# Patient Record
Sex: Male | Born: 1990 | Race: Black or African American | Hispanic: No | Marital: Married | Smoking: Current every day smoker
Health system: Southern US, Community
[De-identification: ages and names within clinical notes are randomized; demographics above are authoritative.]

## PROBLEM LIST (undated history)

## (undated) DIAGNOSIS — K219 Gastro-esophageal reflux disease without esophagitis: Secondary | ICD-10-CM

---

## 2014-03-01 ENCOUNTER — Encounter (HOSPITAL_COMMUNITY): Payer: Self-pay | Admitting: Adult Health

## 2014-03-01 ENCOUNTER — Emergency Department (HOSPITAL_COMMUNITY)
Admission: EM | Admit: 2014-03-01 | Discharge: 2014-03-02 | Disposition: A | Discharge status: Home / Self Care | Payer: Worker's Compensation | Attending: Emergency Medicine | Admitting: Emergency Medicine

## 2014-03-01 DIAGNOSIS — Z720 Tobacco use: Secondary | ICD-10-CM | POA: Insufficient documentation

## 2014-03-01 DIAGNOSIS — Z8719 Personal history of other diseases of the digestive system: Secondary | ICD-10-CM | POA: Insufficient documentation

## 2014-03-01 DIAGNOSIS — T148XXA Other injury of unspecified body region, initial encounter: Secondary | ICD-10-CM

## 2014-03-01 DIAGNOSIS — Y9289 Other specified places as the place of occurrence of the external cause: Secondary | ICD-10-CM | POA: Insufficient documentation

## 2014-03-01 DIAGNOSIS — Y990 Civilian activity done for income or pay: Secondary | ICD-10-CM | POA: Insufficient documentation

## 2014-03-01 DIAGNOSIS — W2209XA Striking against other stationary object, initial encounter: Secondary | ICD-10-CM | POA: Insufficient documentation

## 2014-03-01 DIAGNOSIS — S50811A Abrasion of right forearm, initial encounter: Secondary | ICD-10-CM | POA: Insufficient documentation

## 2014-03-01 DIAGNOSIS — Z23 Encounter for immunization: Secondary | ICD-10-CM | POA: Insufficient documentation

## 2014-03-01 DIAGNOSIS — Y9389 Activity, other specified: Secondary | ICD-10-CM | POA: Insufficient documentation

## 2014-03-01 HISTORY — DX: Gastro-esophageal reflux disease without esophagitis: K21.9

## 2014-03-01 MED ORDER — TETANUS-DIPHTH-ACELL PERTUSSIS 5-2.5-18.5 LF-MCG/0.5 IM SUSP
0.5000 mL | Freq: Once | INTRAMUSCULAR | Status: AC
  Administered 2014-03-02: 0.5 mL via INTRAMUSCULAR
  Filled 2014-03-01: qty 0.5

## 2014-03-01 NOTE — ED Notes (Signed)
Presents with right arm laceration from a door hinge at work. Bleeding controlled

## 2014-03-01 NOTE — ED Provider Notes (Signed)
CSN: [id number]     Arrival date & time 03/01/14  2321 History   First MD Initiated Contact with Patient 03/01/14 2346     Chief Complaint  Patient presents with  . Laceration     (Consider location/radiation/quality/duration/timing/severity/associated sxs/prior Treatment) HPI Kevin Foley is a 23 y.o. male who comes in for evaluation of right arm abrasion. Patient states he was working at [employer] when he scraped his arm against the hinge of the freezer door. He reports washing it off while at work. Bleeding is controlled. Abrasion is painful to palpation. He is unsure when his last tetanus was. He declines any pain at this time. Hemostasis achieved PTA. No other associated symptoms. No other modifying factors  Past Medical History  Diagnosis Date  . GERD (gastroesophageal reflux disease)    History reviewed. No pertinent past surgical history. History reviewed. No pertinent family history. History  Substance Use Topics  . Smoking status: Current Every Day Smoker    Types: Cigarettes  . Smokeless tobacco: Not on file  . Alcohol Use: Yes    Review of Systems  Constitutional: Negative for fever.  Respiratory: Negative for shortness of breath.   Cardiovascular: Negative for chest pain.  Skin: Positive for wound. Negative for rash.      Allergies  Review of patient's allergies indicates no known allergies.  Home Medications   Prior to Admission medications   Not on File   BP 128/94 mmHg  Pulse 65  Temp(Src) 97.8 F (36.6 C) (Oral)  Resp 18  SpO2 99% Physical Exam  Constitutional:  Awake, alert, nontoxic appearance.  HENT:  Head: Atraumatic.  Eyes: Right eye exhibits no discharge. Left eye exhibits no discharge.  Neck: Neck supple.  Pulmonary/Chest: Effort normal. He exhibits no tenderness.  Abdominal: Soft. There is no tenderness. There is no rebound.  Musculoskeletal: He exhibits no tenderness.  Baseline ROM, no obvious new focal weakness.  Neurological:   Mental status and motor strength appears baseline for patient and situation.  Skin: No rash noted.  Small linear, superficial abrasion to the medial aspect of the proximal forearm.  Psychiatric: He has a normal mood and affect.  Nursing note and vitals reviewed.   ED Course  Procedures (including critical care time) Labs Review Labs Reviewed - No data to display  Imaging Review No results found.   EKG Interpretation None     Meds given in ED:  Medications  Tdap (BOOSTRIX) injection 0.5 mL (0.5 mLs Intramuscular Given 03/02/14 0015)    There are no discharge medications for this patient.  Filed Vitals:   03/01/14 2339  BP: 128/94  Pulse: 65  Temp: 97.8 F (36.6 C)  TempSrc: Oral  Resp: 18  SpO2: 99%    MDM  Vitals stable - WNL -afebrile Pt resting comfortably in ED. PE--small abrasion to the medial aspect of right arm. Neurovascularly intact. Distal pulses intact. Tetanus updated in ED. Bacitracin and dressing applied to abrasion. UDS obtained for [employer] purposes. Discussed f/u with PCP and return precautions, pt very amenable to plan. Patient stable, in good condition and is appropriate for discharge   Final diagnoses:  Ch[place], PA-C 03/02/14 [place], MD 03/02/14 1623

## 2014-03-02 NOTE — ED Notes (Signed)
Sample collected by lab

## 2014-03-02 NOTE — Discharge Instructions (Signed)
Abrasion An abrasion is a cut or scrape of the skin. Abrasions do not extend through all layers of the skin and most heal within 10 days. It is important to care for your abrasion properly to prevent infection. CAUSES  Most abrasions are caused by falling on, or gliding across, the ground or other surface. When your skin rubs on something, the outer and inner layer of skin rubs off, causing an abrasion. DIAGNOSIS  Your caregiver will be able to diagnose an abrasion during a physical exam.  TREATMENT  Your treatment depends on how large and deep the abrasion is. Generally, your abrasion will be cleaned with water and a mild soap to remove any dirt or debris. An antibiotic ointment may be put over the abrasion to prevent an infection. A bandage (dressing) may be wrapped around the abrasion to keep it from getting dirty.  You may need a tetanus shot if:  You cannot remember when you had your last tetanus shot.  You have never had a tetanus shot.  The injury broke your skin. If you get a tetanus shot, your arm may swell, get red, and feel warm to the touch. This is common and not a problem. If you need a tetanus shot and you choose not to have one, there is a rare chance of getting tetanus. Sickness from tetanus can be serious.  HOME CARE INSTRUCTIONS   If a dressing was applied, change it at least once a day or as directed by your caregiver. If the bandage sticks, soak it off with warm water.   Wash the area with water and a mild soap to remove all the ointment 2 times a day. Rinse off the soap and pat the area dry with a clean towel.   Reapply any ointment as directed by your caregiver. This will help prevent infection and keep the bandage from sticking. Use gauze over the wound and under the dressing to help keep the bandage from sticking.   Change your dressing right away if it becomes wet or dirty.   Only take over-the-counter or prescription medicines for pain, discomfort, or fever as  directed by your caregiver.   Follow up with your caregiver within 24-48 hours for a wound check, or as directed. If you were not given a wound-check appointment, look closely at your abrasion for redness, swelling, or pus. These are signs of infection. SEEK IMMEDIATE MEDICAL CARE IF:   You have increasing pain in the wound.   You have redness, swelling, or tenderness around the wound.   You have pus coming from the wound.   You have a fever or persistent symptoms for more than 2-3 days.  You have a fever and your symptoms suddenly get worse.  You have a bad smell coming from the wound or dressing.  MAKE SURE YOU:   Understand these instructions.  Will watch your condition.  Will get help right away if you are not doing well or get worse. Document Released: 12/08/2004 Document Revised: 02/15/2012 Document Reviewed: 02/01/2011 [facility] Patient Information 2015 [place], [place]. This information is not intended to replace advice given to you by your health care provider. Make sure you discuss any questions you have with your health care provider.   You were evaluated in the ED today for the cut on her arm. There does not appear to be an emergent symptom caused by your injury. You were given a antibiotic dressing in the ED. You may follow-up with primary care for further evaluation  and management of your symptoms. Return to ED for worsening symptoms, redness or swelling to the site numbness or weakness

## 2014-03-02 NOTE — ED Notes (Signed)
Urine sample for drug screen was unable to be used for drug test.  Second sample to be collected

## 2017-01-22 ENCOUNTER — Emergency Department (HOSPITAL_COMMUNITY)
Admission: EM | Admit: 2017-01-22 | Discharge: 2017-01-22 | Disposition: A | Discharge status: Home / Self Care | Payer: Self-pay | Attending: Emergency Medicine | Admitting: Emergency Medicine

## 2017-01-22 ENCOUNTER — Emergency Department (HOSPITAL_COMMUNITY): Payer: Self-pay

## 2017-01-22 ENCOUNTER — Encounter (HOSPITAL_COMMUNITY): Payer: Self-pay

## 2017-01-22 DIAGNOSIS — F17210 Nicotine dependence, cigarettes, uncomplicated: Secondary | ICD-10-CM | POA: Insufficient documentation

## 2017-01-22 DIAGNOSIS — M25572 Pain in left ankle and joints of left foot: Secondary | ICD-10-CM | POA: Insufficient documentation

## 2017-01-22 MED ORDER — NAPROXEN 250 MG PO TABS
500.0000 mg | ORAL_TABLET | Freq: Once | ORAL | Status: AC
  Administered 2017-01-22: 500 mg via ORAL
  Filled 2017-01-22: qty 2

## 2017-01-22 MED ORDER — ACETAMINOPHEN 500 MG PO TABS
1000.0000 mg | ORAL_TABLET | Freq: Once | ORAL | Status: AC
  Administered 2017-01-22: 1000 mg via ORAL
  Filled 2017-01-22: qty 2

## 2017-01-22 NOTE — ED Triage Notes (Signed)
Patient complains of left ankle pain with ambulation x 2 days, denies trauma.  States that he has old old injury but no new trauma

## 2017-01-22 NOTE — Discharge Instructions (Signed)
Ice, elevate the foot and make sure you are wearing shoe with a good sole.  Use the crutches as needed.  Take Aleve or 600 mg ibuprofen for inflammation.  You can also take additional Tylenol for pain.  Return immediately if you develop redness, fever or discoloration of your foot

## 2017-01-22 NOTE — Progress Notes (Signed)
Orthopedic Tech Progress Note Patient Details:  Kevin Foley 06/19/1990 [id number]  Ortho Devices Type of Ortho Device: Ace wrap, Crutches Ortho Device/Splint Location: lle Ortho Device/Splint Interventions: Application   Kit Brubacher 01/22/2017, 9:10 AM

## 2017-01-22 NOTE — ED Provider Notes (Signed)
[place] EMERGENCY DEPARTMENT Provider Note   CSN: [id number] Arrival date & time: 01/22/17  0732     History   Chief Complaint No chief complaint on file.   HPI Kevin Foley is a 26 y.o. male.  Patient is a 26 year old male who is healthy presenting today with worsening left ankle and foot pain.  Patient denies any trauma but states he does typically stand and walk at work for long periods of time.  He denies any new shoes but states years ago he did have a hairline fracture in that ankle.  He cannot recall if it appears more swollen.  The pain is significantly worse with walking and shoots up his leg.  He describes the pain is dull all the time but then sharp pins and needles when attempting to walk.  He states the pain was so bad yesterday that he was unable to walk.  Also patient states he is tried to soak it in Epson salt and 2 days ago took Tylenol but the pain has not resolved.   The history is provided by the patient.    Past Medical History:  Diagnosis Date  . GERD (gastroesophageal reflux disease)     There are no active problems to display for this patient.   History reviewed. No pertinent surgical history.     Home Medications    Prior to Admission medications   Not on File    Family History No family history on file.  Social History Social History   Tobacco Use  . Smoking status: Current Foley Day Smoker    Types: Cigarettes  . Smokeless tobacco: Never Used  Substance Use Topics  . Alcohol use: Yes  . Drug use: No     Allergies   Patient has no known allergies.   Review of Systems Review of Systems  All other systems reviewed and are negative.    Physical Exam Updated Vital Signs BP 115/83 (BP Location: Right Arm)   Pulse 93   Temp 97.7 F (36.5 C) (Oral)   Resp 17   SpO2 98%   Physical Exam  Constitutional: He is oriented to person, place, and time. He appears well-developed and well-nourished. No  distress.  HENT:  Head: Normocephalic and atraumatic.  Eyes: EOM are normal. Pupils are equal, round, and reactive to light.  Cardiovascular: Normal rate and intact distal pulses.  Pulmonary/Chest: Effort normal.  Musculoskeletal: He exhibits tenderness.       Left ankle: He exhibits decreased range of motion and swelling. He exhibits normal pulse. Tenderness. Lateral malleolus and medial malleolus tenderness found. No head of 5th metatarsal and no proximal fibula tenderness found. Achilles tendon exhibits pain. Achilles tendon exhibits no defect.       Feet:  Minimal swelling of the left ankle no warmth or erythema of the joint.  No calf pain or swelling.  Neurological: He is alert and oriented to person, place, and time.  Skin: Skin is warm. Capillary refill takes less than 2 seconds.  Psychiatric: He has a normal mood and affect. His behavior is normal.  Nursing note and vitals reviewed.    ED Treatments / Results  Labs (all labs ordered are listed, but only abnormal results are displayed) Labs Reviewed - No data to display  EKG  EKG Interpretation None       Radiology Dg Ankle Complete Left  Result Date: 01/22/2017 CLINICAL DATA:  Left ankle pain and swelling.  No known trauma. EXAM: LEFT ANKLE  COMPLETE - 3+ VIEW COMPARISON:  None. FINDINGS: There is mild diffuse soft tissue swelling about the imaged lower leg, ankle and hindfoot. No associated fracture or dislocation. Joint spaces appear preserved. The ankle mortise appears preserved. No definite ankle joint effusion. No significant plantar calcaneal spur. No radiopaque foreign body. IMPRESSION: Nonspecific mild diffuse soft tissue swelling about the imaged lower leg, ankle and hindfoot without associated fracture, dislocation or radiopaque foreign body. Electronically Signed   By: Sandi Mariscal M.D.   On: 01/22/2017 08:39    Procedures Procedures (including critical care time)  Medications Ordered in ED Medications    acetaminophen (TYLENOL) tablet 1,000 mg (not administered)  naproxen (NAPROSYN) tablet 500 mg (not administered)     Initial Impression / Assessment and Plan / ED Course  I have reviewed the triage vital signs and the nursing notes.  Pertinent labs & imaging results that were available during my care of the patient were reviewed by me and considered in my medical decision making (see chart for details).     Patient presenting with worsening pain in the left foot causing it to be difficult to walk.  On exam it is hard to discern whether patient's pain is related to the ankle joint or the fascia.  Patient does have significant tenderness with palpation of the plantar fascia and the Achilles tendon but also has pain with range of motion of the ankle joint and palpation of the lateral medial malleolus.  He denies any trauma there is some mild swelling but no exam findings concerning for gout.  Patient may be having a tendinopathy or plantar fasciitis but will do plain images to ensure no other etiology.  Patient is having pain but significant increased pain and range of motion and axial loading.  Patient has not been taking oral therapy for the pain and was given Tylenol and naproxen.  8:51 AM Imaging is negative for bony abnormality but does show diffuse soft tissue swelling.  Again patient has no signs of infection there is no warmth, erythema and low suspicion for DVT.  Will treat with Ace wrap, anti-inflammatories, ice and elevation.  Patient given follow-up in 1 week if symptoms are not improved.  Final Clinical Impressions(s) / ED Diagnoses   Final diagnoses:  Acute left ankle pain    ED Discharge Orders    None       Blanchie Dessert, MD 01/22/17 [phone]

## 2018-04-14 ENCOUNTER — Encounter (HOSPITAL_COMMUNITY): Payer: Self-pay | Admitting: Emergency Medicine

## 2018-04-14 ENCOUNTER — Emergency Department (HOSPITAL_COMMUNITY)
Admission: EM | Admit: 2018-04-14 | Discharge: 2018-04-14 | Disposition: A | Discharge status: Home / Self Care | Payer: Self-pay | Attending: Emergency Medicine | Admitting: Emergency Medicine

## 2018-04-14 ENCOUNTER — Emergency Department (HOSPITAL_COMMUNITY): Payer: Self-pay

## 2018-04-14 DIAGNOSIS — F17210 Nicotine dependence, cigarettes, uncomplicated: Secondary | ICD-10-CM | POA: Insufficient documentation

## 2018-04-14 DIAGNOSIS — M25571 Pain in right ankle and joints of right foot: Secondary | ICD-10-CM | POA: Insufficient documentation

## 2018-04-14 MED ORDER — PREDNISONE 10 MG PO TABS: 20.0000 mg | ORAL_TABLET | Freq: Two times a day (BID) | ORAL | 0 refills | Status: AC

## 2018-04-14 MED ORDER — HYDROCODONE-ACETAMINOPHEN 5-325 MG PO TABS: 1.0000 | ORAL_TABLET | Freq: Four times a day (QID) | ORAL | 0 refills | Status: AC | PRN

## 2018-04-14 NOTE — ED Notes (Signed)
Patient transported to X-ray 

## 2018-04-14 NOTE — ED Triage Notes (Signed)
  Patient comes in with R ankle pain that has been going on for a couple days.  Patient states he hurt his ankle and feels shooting pain up his calf.  Patient has been using crutches.  Patient states he cant sleep at night.  Pain 10/10.

## 2018-04-14 NOTE — ED Provider Notes (Signed)
[place] EMERGENCY DEPARTMENT Provider Note   CSN: [id number] Arrival date & time: 04/14/18  0451     History   Chief Complaint Chief Complaint  Patient presents with  . Ankle Pain    HPI Kevin Foley is a 28 y.o. male.  Patient is a 28 year old male with no significant past medical history.  He presents today with complaints of right ankle pain.  This began 2 days ago in the absence of any specific injury or trauma.  He states he went to get out of bed and his ankle was hurting him.  He denies any new injury or or activities that would explain his discomfort.  The history is provided by the patient.  Ankle Pain  Location:  Ankle Injury: no   Ankle location:  R ankle Pain details:    Quality:  Throbbing   Radiates to:  Does not radiate   Severity:  Severe   Onset quality:  Sudden   Duration:  2 days   Timing:  Constant   Progression:  Worsening Chronicity:  New Relieved by:  Nothing Worsened by:  Bearing weight Ineffective treatments:  None tried   Past Medical History:  Diagnosis Date  . GERD (gastroesophageal reflux disease)     There are no active problems to display for this patient.   History reviewed. No pertinent surgical history.      Home Medications    Prior to Admission medications   Not on File    Family History History reviewed. No pertinent family history.  Social History Social History   Tobacco Use  . Smoking status: Current Every Day Smoker    Types: Cigarettes  . Smokeless tobacco: Never Used  Substance Use Topics  . Alcohol use: Yes  . Drug use: No     Allergies   Patient has no known allergies.   Review of Systems Review of Systems  All other systems reviewed and are negative.    Physical Exam Updated Vital Signs BP 119/66 (BP Location: Right Arm)   Pulse 89   Temp 98.3 F (36.8 C) (Oral)   Resp 18   Ht 5\' 11"  (1.803 m)   Wt 127.5 kg   SpO2 99%   BMI 39.19 kg/m   Physical  Exam Vitals signs and nursing note reviewed.  Constitutional:      General: He is not in acute distress.    Appearance: Normal appearance. He is not ill-appearing.  HENT:     Head: Normocephalic and atraumatic.  Pulmonary:     Effort: Pulmonary effort is normal.  Musculoskeletal:     Comments: The right ankle appears grossly normal.  There is no significant swelling.  He has tenderness over the Achilles tendon, lateral malleolus, and medial malleolus.  There is no warmth or erythema.  He has pain with range of motion.  Skin:    General: Skin is warm and dry.  Neurological:     Mental Status: He is alert.      ED Treatments / Results  Labs (all labs ordered are listed, but only abnormal results are displayed) Labs Reviewed - No data to display  EKG None  Radiology No results found.  Procedures Procedures (including critical care time)  Medications Ordered in ED Medications - No data to display   Initial Impression / Assessment and Plan / ED Course  I have reviewed the triage vital signs and the nursing notes.  Pertinent labs & imaging results that were available during  my care of the patient were reviewed by me and considered in my medical decision making (see chart for details).  Patient with a 2-day history of nontraumatic right ankle pain.  He has pain with range of motion and palpation.  There is no redness or warmth.  His x-rays are negative.  I suspect gout.  He will be treated with prednisone, pain medicine, and is to follow-up as needed.  Final Clinical Impressions(s) / ED Diagnoses   Final diagnoses:  None    ED Discharge Orders    None       Veryl Speak, MD 04/14/18 [phone]

## 2018-04-14 NOTE — Discharge Instructions (Signed)
Prednisone as prescribed.  Hydrocodone as prescribed as needed for pain.  Return to the emergency department if symptoms worsen or change, and follow-up with your primary doctor if not improving in the next 2 to 3 days.

## 2019-10-03 ENCOUNTER — Ambulatory Visit: Payer: Self-pay | Attending: Internal Medicine

## 2019-10-03 DIAGNOSIS — Z23 Encounter for immunization: Secondary | ICD-10-CM

## 2019-10-03 NOTE — Progress Notes (Signed)
   Covid-19 Vaccination Clinic  Name:  Hooper Petteway    MRN: [id number] DOB: [birth year]  10/03/2019  Mr. Bethel was observed post Covid-19 immunization for 15 minutes without incident. He was provided with Vaccine Information Sheet and instruction to access the V-Safe system.   Mr. Fjelstad was instructed to call 911 with any severe reactions post vaccine: Marland Kitchen Difficulty breathing  . Swelling of face and throat  . A fast heartbeat  . A bad rash all over body  . Dizziness and weakness   Immunizations Administered    Name Date Dose VIS Date Route   Pfizer COVID-19 Vaccine 10/03/2019  3:17 PM 0.3 mL 05/08/2018 Intramuscular   Manufacturer: [employer]   Lot: [id number]   [place]: [id number]

## 2019-10-29 ENCOUNTER — Ambulatory Visit: Payer: Self-pay | Attending: Internal Medicine

## 2019-10-29 DIAGNOSIS — Z23 Encounter for immunization: Secondary | ICD-10-CM

## 2019-10-29 NOTE — Progress Notes (Signed)
   Covid-19 Vaccination Clinic  Name:  Kevin Foley    MRN: [id number] DOB: 1990-12-14  10/29/2019  Kevin Foley was observed post Covid-19 immunization for 15 minutes without incident. He was provided with Vaccine Information Sheet and instruction to access the V-Safe system.   Kevin Foley was instructed to call 911 with any severe reactions post vaccine: Marland Kitchen Difficulty breathing  . Swelling of face and throat  . A fast heartbeat  . A bad rash all over body  . Dizziness and weakness   Immunizations Administered    Name Date Dose VIS Date Route   Pfizer COVID-19 Vaccine 10/29/2019  2:49 PM 0.3 mL 05/08/2018 Intramuscular   Manufacturer: [place]   Lot: [id number]   [place]: [id number]

## 2020-04-10 ENCOUNTER — Other Ambulatory Visit: Payer: Self-pay

## 2020-04-10 DIAGNOSIS — Z20822 Contact with and (suspected) exposure to covid-19: Secondary | ICD-10-CM

## 2020-04-11 LAB — NOVEL CORONAVIRUS, NAA: SARS-CoV-2, NAA: DETECTED — AB

## 2020-04-11 LAB — SARS-COV-2, NAA 2 DAY TAT

## 2020-06-07 ENCOUNTER — Other Ambulatory Visit: Payer: Self-pay

## 2020-06-07 ENCOUNTER — Emergency Department (HOSPITAL_COMMUNITY): Payer: BC Managed Care – PPO

## 2020-06-07 ENCOUNTER — Emergency Department (HOSPITAL_COMMUNITY)
Admission: EM | Admit: 2020-06-07 | Discharge: 2020-06-07 | Disposition: A | Discharge status: Home / Self Care | Payer: BC Managed Care – PPO | Attending: Emergency Medicine | Admitting: Emergency Medicine

## 2020-06-07 ENCOUNTER — Encounter (HOSPITAL_COMMUNITY): Payer: Self-pay | Admitting: Emergency Medicine

## 2020-06-07 DIAGNOSIS — F17210 Nicotine dependence, cigarettes, uncomplicated: Secondary | ICD-10-CM | POA: Insufficient documentation

## 2020-06-07 DIAGNOSIS — R109 Unspecified abdominal pain: Secondary | ICD-10-CM

## 2020-06-07 DIAGNOSIS — M545 Low back pain, unspecified: Secondary | ICD-10-CM | POA: Diagnosis not present

## 2020-06-07 DIAGNOSIS — S060X1A Concussion with loss of consciousness of 30 minutes or less, initial encounter: Secondary | ICD-10-CM

## 2020-06-07 DIAGNOSIS — S0990XA Unspecified injury of head, initial encounter: Secondary | ICD-10-CM | POA: Diagnosis present

## 2020-06-07 DIAGNOSIS — R1032 Left lower quadrant pain: Secondary | ICD-10-CM | POA: Diagnosis not present

## 2020-06-07 DIAGNOSIS — Y92410 Unspecified street and highway as the place of occurrence of the external cause: Secondary | ICD-10-CM | POA: Diagnosis not present

## 2020-06-07 DIAGNOSIS — S060X9A Concussion with loss of consciousness of unspecified duration, initial encounter: Secondary | ICD-10-CM | POA: Insufficient documentation

## 2020-06-07 DIAGNOSIS — M542 Cervicalgia: Secondary | ICD-10-CM | POA: Diagnosis not present

## 2020-06-07 DIAGNOSIS — M7918 Myalgia, other site: Secondary | ICD-10-CM

## 2020-06-07 LAB — COMPREHENSIVE METABOLIC PANEL
ALT: 55 U/L — ABNORMAL HIGH (ref 0–44)
AST: 26 U/L (ref 15–41)
Albumin: 4 g/dL (ref 3.5–5.0)
Alkaline Phosphatase: 52 U/L (ref 38–126)
Anion gap: 8 (ref 5–15)
BUN: 11 mg/dL (ref 6–20)
CO2: 22 mmol/L (ref 22–32)
Calcium: 9.1 mg/dL (ref 8.9–10.3)
Chloride: 107 mmol/L (ref 98–111)
Creatinine, Ser: 0.96 mg/dL (ref 0.61–1.24)
GFR, Estimated: >60 mL/min (ref >60)
Glucose, Bld: 87 mg/dL (ref 70–99)
Potassium: 4.1 mmol/L (ref 3.5–5.1)
Sodium: 137 mmol/L (ref 135–145)
Total Bilirubin: 0.4 mg/dL (ref 0.3–1.2)
Total Protein: 6.8 g/dL (ref 6.5–8.1)

## 2020-06-07 LAB — CBC WITH DIFFERENTIAL/PLATELET
Abs Immature Granulocytes: 0.05 10*3/uL (ref 0.00–0.07)
Basophils Absolute: 0.1 10*3/uL (ref 0.0–0.1)
Basophils Relative: 1 %
Eosinophils Absolute: 0.4 10*3/uL (ref 0.0–0.5)
Eosinophils Relative: 3 %
HCT: 44.8 % (ref 39.0–52.0)
Hemoglobin: 14.6 g/dL (ref 13.0–17.0)
Immature Granulocytes: 0 %
Lymphocytes Relative: 32 %
Lymphs Abs: 4.6 10*3/uL — ABNORMAL HIGH (ref 0.7–4.0)
MCH: 27.6 pg (ref 26.0–34.0)
MCHC: 32.6 g/dL (ref 30.0–36.0)
MCV: 84.7 fL (ref 80.0–100.0)
Monocytes Absolute: 0.8 10*3/uL (ref 0.1–1.0)
Monocytes Relative: 5 %
Neutro Abs: 8.8 10*3/uL — ABNORMAL HIGH (ref 1.7–7.7)
Neutrophils Relative %: 59 %
Platelets: 268 10*3/uL (ref 150–400)
RBC: 5.29 MIL/uL (ref 4.22–5.81)
RDW: 13.3 % (ref 11.5–15.5)
WBC: 14.7 10*3/uL — ABNORMAL HIGH (ref 4.0–10.5)
nRBC: 0 % (ref 0.0–0.2)

## 2020-06-07 LAB — URINALYSIS, ROUTINE W REFLEX MICROSCOPIC
Bilirubin Urine: NEGATIVE
Glucose, UA: NEGATIVE mg/dL
Hgb urine dipstick: NEGATIVE
Ketones, ur: NEGATIVE mg/dL
Leukocytes,Ua: NEGATIVE
Nitrite: NEGATIVE
Protein, ur: NEGATIVE mg/dL
Specific Gravity, Urine: 1.006 (ref 1.005–1.030)
pH: 5 (ref 5.0–8.0)

## 2020-06-07 MED ORDER — IOHEXOL 300 MG/ML  SOLN
100.0000 mL | Freq: Once | INTRAMUSCULAR | Status: AC | PRN
  Administered 2020-06-07: 100 mL via INTRAVENOUS

## 2020-06-07 MED ORDER — METHOCARBAMOL 500 MG PO TABS
500.0000 mg | ORAL_TABLET | Freq: Once | ORAL | Status: AC
  Administered 2020-06-07: 500 mg via ORAL
  Filled 2020-06-07: qty 1

## 2020-06-07 MED ORDER — FENTANYL CITRATE (PF) 100 MCG/2ML IJ SOLN
50.0000 ug | Freq: Once | INTRAMUSCULAR | Status: AC
  Administered 2020-06-07: 50 ug via INTRAVENOUS
  Filled 2020-06-07: qty 2

## 2020-06-07 MED ORDER — ONDANSETRON HCL 4 MG/2ML IJ SOLN
4.0000 mg | Freq: Once | INTRAMUSCULAR | Status: AC
  Administered 2020-06-07: 4 mg via INTRAVENOUS
  Filled 2020-06-07: qty 2

## 2020-06-07 MED ORDER — ACETAMINOPHEN 500 MG PO TABS
1000.0000 mg | ORAL_TABLET | Freq: Once | ORAL | Status: AC
  Administered 2020-06-07: 1000 mg via ORAL
  Filled 2020-06-07: qty 2

## 2020-06-07 MED ORDER — MORPHINE SULFATE (PF) 4 MG/ML IV SOLN
4.0000 mg | Freq: Once | INTRAVENOUS | Status: AC
  Administered 2020-06-07: 4 mg via INTRAVENOUS
  Filled 2020-06-07: qty 1

## 2020-06-07 NOTE — Discharge Instructions (Signed)
Your imaging is reassuring. The pain you are experiencing is likely due to muscle strain, you may take Ibuprofen or Naprosyn and Robaxin as needed for pain management. Do not combine with any pain reliever other than tylenol.  You may also use ice and heat, and over-the-counter remedies such as Biofreeze gel or salon pas lidocaine patches. The muscle soreness should improve over the next week. Follow up with your family doctor in the next week for a recheck if you are still having symptoms. Return to ED if pain is worsening, you develop weakness or numbness of extremities, or new or concerning symptoms develop.  A concussion is a very mild traumatic brain injury caused by a bump, jolt or blow to the head, most people recover quickly and fully. You can experience a wide variety of symptoms including:   - Confusion      - Difficulty concentrating       - Trouble remembering new info  - Headache      - Dizziness        - Fuzzy or blurry vision  - Fatigue      - Balance problems      - Light sensitivity  - Mood swings     - Changes in sleep or difficulty sleeping   To help these symptoms improve make sure you are getting plenty of rest, avoid screen time, loud music and strenuous mental activities. Avoid any strenuous physical activities, once your symptoms have resolved a slow and gradual return to activity is recommended. It is very important that you avoid situations in which you might sustain a second head injury as this can be very dangerous and life threatening. You cannot be medically cleared to return to normal activities until you have followed up with your primary doctor or a concussion specialist for reevaluation.
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
## 2020-06-07 NOTE — ED Notes (Signed)
X-ray at bedside
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
## 2020-06-12 ENCOUNTER — Other Ambulatory Visit: Payer: Self-pay

## 2020-06-12 ENCOUNTER — Ambulatory Visit (INDEPENDENT_AMBULATORY_CARE_PROVIDER_SITE_OTHER): Payer: BC Managed Care – PPO | Admitting: Family Medicine

## 2020-06-12 ENCOUNTER — Ambulatory Visit: Payer: BC Managed Care – PPO | Admitting: Family Medicine

## 2020-06-12 ENCOUNTER — Encounter: Payer: Self-pay | Admitting: Family Medicine

## 2020-06-12 VITALS — BP 124/86 | HR 80 | Ht 71.0 in | Wt 277.6 lb

## 2020-06-12 DIAGNOSIS — S134XXA Sprain of ligaments of cervical spine, initial encounter: Secondary | ICD-10-CM | POA: Insufficient documentation

## 2020-06-12 DIAGNOSIS — M7989 Other specified soft tissue disorders: Secondary | ICD-10-CM | POA: Diagnosis not present

## 2020-06-12 DIAGNOSIS — S060XAA Concussion with loss of consciousness status unknown, initial encounter: Secondary | ICD-10-CM | POA: Insufficient documentation

## 2020-06-12 DIAGNOSIS — S060X0A Concussion without loss of consciousness, initial encounter: Secondary | ICD-10-CM

## 2020-06-12 DIAGNOSIS — S060X9A Concussion with loss of consciousness of unspecified duration, initial encounter: Secondary | ICD-10-CM | POA: Insufficient documentation

## 2020-06-12 DIAGNOSIS — M542 Cervicalgia: Secondary | ICD-10-CM | POA: Diagnosis not present
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
## 2020-06-12 NOTE — Patient Instructions (Signed)
Good to see you!  Stay our of work till next Monday, April 11th.  Schedule a follow up visit to see Dr. Georgina Snell next week.  Fish oil 2g dail   Co Q 10- 200 mg daily  Choline 500g daily  PT [facility]
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
## 2020-06-12 NOTE — Assessment & Plan Note (Signed)
Lumbar area does have a soft tissue mass noted.  This was on a CT scan and an incidental finding.  Patient though is tender to palpation in this area today.  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  Patient will have this ordered but do not think it is an emergent situation.

## 2020-06-16 ENCOUNTER — Ambulatory Visit
Admission: RE | Admit: 2020-06-16 | Discharge: 2020-06-16 | Disposition: A | Discharge status: Home / Self Care | Payer: BC Managed Care – PPO | Source: Ambulatory Visit | Attending: Family Medicine | Admitting: Family Medicine

## 2020-06-16 DIAGNOSIS — M7989 Other specified soft tissue disorders: Secondary | ICD-10-CM

## 2020-06-17 ENCOUNTER — Other Ambulatory Visit: Payer: Self-pay

## 2020-06-17 DIAGNOSIS — C499 Malignant neoplasm of connective and soft tissue, unspecified: Secondary | ICD-10-CM

## 2020-06-19 ENCOUNTER — Encounter: Payer: Self-pay | Admitting: Family Medicine

## 2020-06-19 ENCOUNTER — Encounter (HOSPITAL_BASED_OUTPATIENT_CLINIC_OR_DEPARTMENT_OTHER): Payer: Self-pay | Admitting: Physical Therapy

## 2020-06-19 ENCOUNTER — Ambulatory Visit (HOSPITAL_BASED_OUTPATIENT_CLINIC_OR_DEPARTMENT_OTHER): Payer: BC Managed Care – PPO | Attending: Family Medicine | Admitting: Physical Therapy

## 2020-06-19 ENCOUNTER — Telehealth: Payer: Self-pay | Admitting: Family Medicine

## 2020-06-19 ENCOUNTER — Other Ambulatory Visit: Payer: Self-pay

## 2020-06-19 DIAGNOSIS — R293 Abnormal posture: Secondary | ICD-10-CM | POA: Diagnosis present

## 2020-06-19 DIAGNOSIS — M542 Cervicalgia: Secondary | ICD-10-CM | POA: Insufficient documentation

## 2020-06-19 NOTE — Telephone Encounter (Signed)
Patient's wife called stating that the patient is still having a lot of back pain along with headaches and continued memory issues. He is scheduled to return to work on Monday but is worried about going back at this point.  Patient was seen back on 06/12/2020 by Dr Tamala Julian and has a scheduled follow up on 06/23/2020. He has also been going to PT.  Is there anything we can do to help with Dr Tamala Julian being out today?

## 2020-06-19 NOTE — Telephone Encounter (Signed)
Called patient back.  Discussed situation.  Will extend work note until his visit with Dr. Tamala Julian on the 12th.
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
## 2020-06-22 ENCOUNTER — Other Ambulatory Visit: Payer: Self-pay

## 2020-06-22 ENCOUNTER — Encounter (HOSPITAL_BASED_OUTPATIENT_CLINIC_OR_DEPARTMENT_OTHER): Payer: Self-pay | Admitting: Physical Therapy

## 2020-06-22 ENCOUNTER — Ambulatory Visit (HOSPITAL_BASED_OUTPATIENT_CLINIC_OR_DEPARTMENT_OTHER): Payer: BC Managed Care – PPO | Admitting: Physical Therapy

## 2020-06-22 DIAGNOSIS — M542 Cervicalgia: Secondary | ICD-10-CM

## 2020-06-22 DIAGNOSIS — R293 Abnormal posture: Secondary | ICD-10-CM
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
## 2020-06-23 ENCOUNTER — Encounter: Payer: Self-pay | Admitting: Family Medicine

## 2020-06-23 ENCOUNTER — Ambulatory Visit: Payer: BC Managed Care – PPO | Admitting: Family Medicine

## 2020-06-23 ENCOUNTER — Ambulatory Visit (INDEPENDENT_AMBULATORY_CARE_PROVIDER_SITE_OTHER): Payer: BC Managed Care – PPO | Admitting: Family Medicine

## 2020-06-23 DIAGNOSIS — M7989 Other specified soft tissue disorders: Secondary | ICD-10-CM | POA: Diagnosis not present

## 2020-06-23 DIAGNOSIS — S060X0D Concussion without loss of consciousness, subsequent encounter: Secondary | ICD-10-CM | POA: Diagnosis not present

## 2020-06-23 DIAGNOSIS — S134XXD Sprain of ligaments of cervical spine, subsequent encounter: Secondary | ICD-10-CM

## 2020-06-23 NOTE — Assessment & Plan Note (Signed)
No significant change.  Encourage patient to continue with physical therapy.

## 2020-06-23 NOTE — Patient Instructions (Signed)
Continue Vitamins and PT You are making progress but it is slow Any worsening symptoms, seek medical attention immediately See me again in 2 weeks
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
## 2020-06-23 NOTE — Assessment & Plan Note (Signed)
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  Patient has been referred to general surgery and is seeing them tomorrow.

## 2020-06-24 ENCOUNTER — Ambulatory Visit (HOSPITAL_BASED_OUTPATIENT_CLINIC_OR_DEPARTMENT_OTHER): Payer: BC Managed Care – PPO | Admitting: Physical Therapy

## 2020-06-25 ENCOUNTER — Ambulatory Visit (HOSPITAL_BASED_OUTPATIENT_CLINIC_OR_DEPARTMENT_OTHER): Payer: BC Managed Care – PPO | Admitting: Physical Therapy

## 2020-06-25 ENCOUNTER — Encounter (HOSPITAL_BASED_OUTPATIENT_CLINIC_OR_DEPARTMENT_OTHER): Payer: Self-pay | Admitting: Physical Therapy

## 2020-06-25 ENCOUNTER — Other Ambulatory Visit: Payer: Self-pay

## 2020-06-25 DIAGNOSIS — M542 Cervicalgia: Secondary | ICD-10-CM

## 2020-06-25 DIAGNOSIS — R293 Abnormal posture: Secondary | ICD-10-CM
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
## 2020-06-29 ENCOUNTER — Ambulatory Visit (HOSPITAL_BASED_OUTPATIENT_CLINIC_OR_DEPARTMENT_OTHER): Payer: BC Managed Care – PPO | Admitting: Physical Therapy

## 2020-06-29 ENCOUNTER — Encounter (HOSPITAL_BASED_OUTPATIENT_CLINIC_OR_DEPARTMENT_OTHER): Payer: Self-pay | Admitting: Physical Therapy

## 2020-06-29 ENCOUNTER — Other Ambulatory Visit: Payer: Self-pay

## 2020-06-29 DIAGNOSIS — M542 Cervicalgia: Secondary | ICD-10-CM

## 2020-06-29 DIAGNOSIS — R293 Abnormal posture: Secondary | ICD-10-CM
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
## 2020-06-30 ENCOUNTER — Telehealth: Payer: Self-pay

## 2020-06-30 ENCOUNTER — Other Ambulatory Visit: Payer: Self-pay

## 2020-06-30 DIAGNOSIS — M7989 Other specified soft tissue disorders: Secondary | ICD-10-CM

## 2020-06-30 NOTE — Telephone Encounter (Signed)
Called patient as physical therapist had written message that patient was not seen by General Surgery. Patient states that General called him and cancelled his appointment as they felt that the lumbar soft tissue mass needs to be seen by Neurosurgery. Urgent referral placed. Patient notified.

## 2020-07-03 ENCOUNTER — Other Ambulatory Visit: Payer: Self-pay | Admitting: Radiation Therapy

## 2020-07-03 ENCOUNTER — Ambulatory Visit (HOSPITAL_BASED_OUTPATIENT_CLINIC_OR_DEPARTMENT_OTHER): Payer: BC Managed Care – PPO | Admitting: Physical Therapy

## 2020-07-03 ENCOUNTER — Encounter (HOSPITAL_BASED_OUTPATIENT_CLINIC_OR_DEPARTMENT_OTHER): Payer: Self-pay | Admitting: Physical Therapy

## 2020-07-03 ENCOUNTER — Other Ambulatory Visit: Payer: Self-pay

## 2020-07-03 DIAGNOSIS — R293 Abnormal posture: Secondary | ICD-10-CM

## 2020-07-03 DIAGNOSIS — M542 Cervicalgia: Secondary | ICD-10-CM
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
## 2020-07-06 ENCOUNTER — Inpatient Hospital Stay: Payer: BC Managed Care – PPO | Attending: Neurological Surgery

## 2020-07-06 ENCOUNTER — Other Ambulatory Visit: Payer: Self-pay

## 2020-07-06 ENCOUNTER — Encounter (HOSPITAL_BASED_OUTPATIENT_CLINIC_OR_DEPARTMENT_OTHER): Payer: Self-pay | Admitting: Physical Therapy

## 2020-07-06 ENCOUNTER — Ambulatory Visit (HOSPITAL_BASED_OUTPATIENT_CLINIC_OR_DEPARTMENT_OTHER): Payer: BC Managed Care – PPO | Admitting: Physical Therapy

## 2020-07-06 DIAGNOSIS — R293 Abnormal posture: Secondary | ICD-10-CM

## 2020-07-06 DIAGNOSIS — M542 Cervicalgia: Secondary | ICD-10-CM
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
## 2020-07-07 ENCOUNTER — Other Ambulatory Visit: Payer: Self-pay

## 2020-07-07 ENCOUNTER — Encounter: Payer: Self-pay | Admitting: Family Medicine

## 2020-07-07 ENCOUNTER — Ambulatory Visit (INDEPENDENT_AMBULATORY_CARE_PROVIDER_SITE_OTHER): Payer: BC Managed Care – PPO | Admitting: Family Medicine

## 2020-07-07 VITALS — BP 118/86 | HR 81 | Ht 71.0 in | Wt 280.0 lb

## 2020-07-07 DIAGNOSIS — S060X0D Concussion without loss of consciousness, subsequent encounter: Secondary | ICD-10-CM

## 2020-07-07 DIAGNOSIS — M7989 Other specified soft tissue disorders: Secondary | ICD-10-CM

## 2020-07-07 DIAGNOSIS — M542 Cervicalgia: Secondary | ICD-10-CM | POA: Diagnosis not present

## 2020-07-07 DIAGNOSIS — S134XXD Sprain of ligaments of cervical spine, subsequent encounter: Secondary | ICD-10-CM

## 2020-07-07 NOTE — Assessment & Plan Note (Signed)
Patient did miss his appointment with the specialist yesterday.  There is plan to see them next week for further evaluation.  Encourage patient to not miss this appointment and he seems to understand the importance of this.

## 2020-07-07 NOTE — Assessment & Plan Note (Signed)
I believe that patient is having more cervicogenic headaches.  Patient states if anything the headaches do seem to be getting worse.  At this point with patient has been having some intermittent radicular symptoms I do think we do need to consider advanced imaging.  Patient does have some very mild weakness noted of the left upper extremity which is also new from previous exam.  We will get an MRI of the brain with and without contrast as well as MRI of the cervical spine.  Looking for such things as Chiari malformation or potential small disc herniation that could be contributing.  Patient CT scans previously were fairly unremarkable.  Patient was having worsening symptoms at this time, will hold on physical therapy.  Follow-up with me again after imaging to discuss.  Hold patient out of work still for another 2 weeks.

## 2020-07-07 NOTE — Assessment & Plan Note (Signed)
I believe that some of the concussion type symptoms seem to be a little bit better.  Concentration seems better but unfortunately having worsening headaches.  Patient knows that if any significant worsening headache to seek medical attention immediately.  Patient is accompanied with wife and we did discuss this.  We will be getting advanced imaging as we discussed previously.

## 2020-07-07 NOTE — Patient Instructions (Addendum)
MRI brain w wo contrast MRI cervical wo  [phone] [facility]   See you back in 2 weeks

## 2020-07-10 ENCOUNTER — Encounter (HOSPITAL_BASED_OUTPATIENT_CLINIC_OR_DEPARTMENT_OTHER): Payer: Self-pay | Admitting: Physical Therapy

## 2020-07-10 ENCOUNTER — Ambulatory Visit (HOSPITAL_BASED_OUTPATIENT_CLINIC_OR_DEPARTMENT_OTHER): Payer: BC Managed Care – PPO | Admitting: Physical Therapy

## 2020-07-10 ENCOUNTER — Other Ambulatory Visit: Payer: Self-pay

## 2020-07-10 DIAGNOSIS — M542 Cervicalgia: Secondary | ICD-10-CM | POA: Diagnosis not present

## 2020-07-10 DIAGNOSIS — R293 Abnormal posture: Secondary | ICD-10-CM
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
## 2020-07-13 ENCOUNTER — Encounter (HOSPITAL_BASED_OUTPATIENT_CLINIC_OR_DEPARTMENT_OTHER): Payer: Self-pay | Admitting: Physical Therapy

## 2020-07-13 ENCOUNTER — Other Ambulatory Visit: Payer: Self-pay

## 2020-07-13 ENCOUNTER — Ambulatory Visit (HOSPITAL_BASED_OUTPATIENT_CLINIC_OR_DEPARTMENT_OTHER): Payer: BC Managed Care – PPO | Attending: Family Medicine | Admitting: Physical Therapy

## 2020-07-13 DIAGNOSIS — R293 Abnormal posture: Secondary | ICD-10-CM | POA: Insufficient documentation

## 2020-07-13 DIAGNOSIS — M542 Cervicalgia: Secondary | ICD-10-CM | POA: Diagnosis present
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
## 2020-07-15 ENCOUNTER — Other Ambulatory Visit: Payer: Self-pay

## 2020-07-15 ENCOUNTER — Ambulatory Visit (HOSPITAL_BASED_OUTPATIENT_CLINIC_OR_DEPARTMENT_OTHER): Payer: BC Managed Care – PPO | Admitting: Physical Therapy

## 2020-07-15 ENCOUNTER — Encounter (HOSPITAL_BASED_OUTPATIENT_CLINIC_OR_DEPARTMENT_OTHER): Payer: Self-pay | Admitting: Physical Therapy

## 2020-07-15 DIAGNOSIS — M542 Cervicalgia: Secondary | ICD-10-CM | POA: Diagnosis not present

## 2020-07-15 DIAGNOSIS — R293 Abnormal posture: Secondary | ICD-10-CM
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
## 2020-07-17 NOTE — Progress Notes (Signed)
[facility] [address] [place] Phone: [phone] Subjective:   I Kevin Foley am serving as a Education administrator for Dr. Hulan Saas.  This visit occurred during the SARS-CoV-2 public health emergency.  Safety protocols were in place, including screening questions prior to the visit, additional usage of staff PPE, and extensive cleaning of exam room while observing appropriate contact time as indicated for disinfecting solutions.   I'm seeing this patient by the request  of:  Patient, No Pcp Per (Inactive)  CC: Headache, whiplash, concussion follow-up  KY[facility]:[id number]   07/07/2020 Patient did miss his appointment with the specialist yesterday.  There is plan to see them next week for further evaluation.  Encourage patient to not miss this appointment and he seems to understand the importance of this.  I believe that some of the concussion type symptoms seem to be a little bit better.  Concentration seems better but unfortunately having worsening headaches.  Patient knows that if any significant worsening headache to seek medical attention immediately.  Patient is accompanied with wife and we did discuss this.  We will be getting advanced imaging as we discussed previously.  I believe that patient is having more cervicogenic headaches.  Patient states if anything the headaches do seem to be getting worse.  At this point with patient has been having some intermittent radicular symptoms I do think we do need to consider advanced imaging.  Patient does have some very mild weakness noted of the left upper extremity which is also new from previous exam.  We will get an MRI of the brain with and without contrast as well as MRI of the cervical spine.  Looking for such things as Chiari malformation or potential small disc herniation that could be contributing.  Patient CT scans previously were fairly unremarkable.  Patient was having worsening symptoms at this time,  will hold on physical therapy.  Follow-up with me again after imaging to discuss.  Hold patient out of work still for another 2 weeks.  Update 07/21/2020 Kevin Foley is a 30 y.o. male coming in with complaint of head injury and neck pain. Patient states he is feeling bad. Still having headache, neck and back pain. Has a biopsy today and he is nervous about it.  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  Was found to have a retention cyst with mild left mastoid findings of a possible infection. MRI cervical only showed a very mild disc protrusion at C4-C6 with no neural neural impingement    Past Medical History:  Diagnosis Date  . GERD (gastroesophageal reflux disease)    No past surgical history on file. Social History   Socioeconomic History  . Marital status: Married    Spouse name: Not on file  . Number of children: Not on file  . Years of education: Not on file  . Highest education level: Not on file  Occupational History  . Not on file  Tobacco Use  . Smoking status: Current Every Day Smoker    Types: Cigarettes  . Smokeless tobacco: Never Used  Substance and Sexual Activity  . Alcohol use: Yes  . Drug use: No  . Sexual activity: Not on file  Other Topics Concern  . Not on file  Social History Narrative  . Not on file   Social Determinants of Health   Financial Resource  Strain: Not on file  Food Insecurity: Not on file  Transportation Needs: Not on file  Physical Activity: Not on file  Stress: Not on file  Social Connections: Not on file   No Known Allergies No family history on file.  Current Outpatient Medications (Endocrine & Metabolic):  .  predniSONE (DELTASONE) 10 MG tablet, Take 2 tablets (20 mg total) by mouth 2 (two) times daily with a meal.    Current Outpatient Medications (Analgesics):  .  acetaminophen (TYLENOL) 500 MG  tablet, Take 500 mg by mouth every 6 (six) hours as needed for moderate pain. Marland Kitchen  HYDROcodone-acetaminophen (NORCO) 5-325 MG tablet, Take 1-2 tablets by mouth every 6 (six) hours as needed. Marland Kitchen  ibuprofen (ADVIL) 200 MG tablet, Take 600 mg by mouth every 6 (six) hours as needed for mild pain.   Current Outpatient Medications (Other):  .  doxycycline (VIBRA-TABS) 100 MG tablet, Take 1 tablet (100 mg total) by mouth 2 (two) times daily. .  methocarbamol (ROBAXIN) 500 MG tablet, Take 500 mg by mouth every 6 (six) hours as needed for muscle spasms. Marland Kitchen  venlafaxine XR (EFFEXOR XR) 37.5 MG 24 hr capsule, Take 1 capsule (37.5 mg total) by mouth daily with breakfast.   Reviewed prior external information including notes and imaging from  primary care provider As well as notes that were available from care everywhere and other healthcare systems.  Past medical history, social, surgical and family history all reviewed in electronic medical record.  No pertanent information unless stated regarding to the chief complaint.   Review of Systems:  No  visual changes, nausea, vomiting, diarrhea, constipation, dizziness, abdominal pain, skin rash, fevers, chills, night sweats, weight loss, swollen lymph nodes,  joint swelling, chest pain, shortness of breath, mood changes. POSITIVE muscle aches, body aches, headache  Objective  Blood pressure 120/82, pulse 77, height 5\' 11"  (1.803 m), weight 283 lb (128.4 kg), SpO2 98 %.   General: No apparent distress alert and oriented x3 mood and affect normal, dressed appropriately.  HEENT: Pupils equal, extraocular movements intact no signs of any nystagmus. Respiratory: Patient's speak in full sentences and does not appear short of breath  Cardiovascular: No lower extremity edema, non tender, no erythema  Gait normal with good balance and coordination.  MSK: Neck exam still has loss of lordosis.  Patient does have tenderness to palpation diffusely in the neck and the  upper trapezius area.    Impression and Recommendations:     The above documentation has been reviewed and is accurate and complete Lyndal Pulley, DO

## 2020-07-19 ENCOUNTER — Ambulatory Visit
Admission: RE | Admit: 2020-07-19 | Discharge: 2020-07-19 | Disposition: A | Discharge status: Home / Self Care | Payer: BC Managed Care – PPO | Source: Ambulatory Visit | Attending: Family Medicine | Admitting: Family Medicine

## 2020-07-19 ENCOUNTER — Other Ambulatory Visit: Payer: BC Managed Care – PPO

## 2020-07-19 ENCOUNTER — Other Ambulatory Visit: Payer: Self-pay

## 2020-07-19 DIAGNOSIS — S134XXD Sprain of ligaments of cervical spine, subsequent encounter: Secondary | ICD-10-CM

## 2020-07-19 DIAGNOSIS — M542 Cervicalgia: Secondary | ICD-10-CM

## 2020-07-19 MED ORDER — GADOBENATE DIMEGLUMINE 529 MG/ML IV SOLN
20.0000 mL | Freq: Once | INTRAVENOUS | Status: AC | PRN
  Administered 2020-07-19: 20 mL via INTRAVENOUS

## 2020-07-20 ENCOUNTER — Encounter (HOSPITAL_BASED_OUTPATIENT_CLINIC_OR_DEPARTMENT_OTHER): Payer: Self-pay | Admitting: Physical Therapy

## 2020-07-20 ENCOUNTER — Ambulatory Visit (HOSPITAL_BASED_OUTPATIENT_CLINIC_OR_DEPARTMENT_OTHER): Payer: BC Managed Care – PPO | Admitting: Physical Therapy

## 2020-07-20 DIAGNOSIS — M542 Cervicalgia: Secondary | ICD-10-CM | POA: Diagnosis not present

## 2020-07-20 DIAGNOSIS — R293 Abnormal posture: Secondary | ICD-10-CM

## 2020-07-20 NOTE — Therapy (Signed)
[facility] [address] [place], [place], [address] Phone: [phone]   Fax:  [phone]  Physical Therapy Treatment  Patient Details  Name: Kevin Foley MRN: [id number] Date of Birth: May 14, 1990 Referring Provider (PT): Hulan Saas, DO   Encounter Date: 07/20/2020   PT End of Session - 07/20/20 1218    Visit Number 10    Number of Visits 13    Date for PT Re-Evaluation 07/31/20    Authorization Type BCBS- 90 VL    PT Start Time 1143    PT Stop Time 1218    PT Time Calculation (min) 35 min    Activity Tolerance Patient tolerated treatment well    Behavior During Therapy [facility] for tasks assessed/performed           Past Medical History:  Diagnosis Date  . GERD (gastroesophageal reflux disease)     History reviewed. No pertinent surgical history.  There were no vitals filed for this visit.   Subjective Assessment - 07/20/20 1154    Subjective Neck is still hurting.    Patient Stated Goals decrease pain, lifting    Currently in Pain? Yes    Pain Score 8     Pain Location Neck    Pain Radiating Towards HA- Lt temporal and frontal region                             [facility] Adult PT Treatment/Exercise - 07/20/20 0001      Manual Therapy   Soft tissue mobilization bil upper traps, bil levator scap, suboccipitals    Manual Traction cervical      Kinesiotix   Facilitate Muscle  scap retraction+shoulder depression                  PT Education - 07/20/20 1220    Education Details discussed MRI results    Person(s) Educated Patient    Methods Explanation    Comprehension Verbalized understanding;Need further instruction            PT Short Term Goals - 07/13/20 1141      PT SHORT TERM GOAL #1   Title 10 deg improvement in rotation & SB    Baseline see flowsheet    Status Partially Met             PT Long Term Goals - 07/15/20 1148      PT LONG TERM GOAL #1   Title  cervical rotation bilaterally to 60 deg    Baseline met passively but not actively    Status Partially Met      PT LONG TERM GOAL #2   Title gross UE strength 5/5    Baseline gross 5/5 with bil pain    Status Partially Met      PT LONG TERM GOAL #3   Title able to demo proper form for bending/lifting    Baseline slouching posture    Status On-going      PT LONG TERM GOAL #4   Title resolution of UE symptoms    Baseline Lt hand N/T on and off    Status On-going                 Plan - 07/20/20 1227    Clinical Impression Statement pt is nervous about his biopsy tomorrow and was stressed by not understanding the MRI reports. He has a visit with Dr Tamala Julian so I told him  I thought they would be further discussed but I did a brief education session with him about the reports. 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 Soft tissues are less tense than they have been in the past but cont to have HA. Ktape placed again today as postural reminder as he continues to slouch and states he has a hard time remembering to correct it.    PT Treatment/Interventions ADLs/Self Care Home Management;Cryotherapy;Traction;Moist Heat;Functional mobility training;Therapeutic activities;Therapeutic exercise;Neuromuscular re-education;Manual techniques;Patient/family education;Passive range of motion;Taping    PT Next Visit Plan **recert for modalities/DN if able after biopsy, cont manual treatment & improving gross motion, cont periscap strengthening    PT Home Exercise Plan [id number]    Consulted and Agree with Plan of Care Patient           Patient will benefit from skilled therapeutic intervention in order to improve the following deficits and impairments:  Decreased range of motion,Increased muscle spasms,Decreased activity tolerance,Pain,Improper body mechanics,Impaired flexibility,Impaired sensation,Postural dysfunction  Visit  Diagnosis: Abnormal posture  Cervicalgia     Problem List Patient Active Problem List   Diagnosis Date Noted  . Mild concussion 06/12/2020  . Whiplash 06/12/2020  . Soft tissue mass 06/12/2020    Frank Pilger C. Rodolphe Edmonston PT, DPT 07/20/20 12:31 PM   [facility] [address] [place], [place], [address] Phone: [phone]   Fax:  [phone]  Name: Kevin Foley MRN: [id number] Date of Birth: [birth year]

## 2020-07-21 ENCOUNTER — Encounter: Payer: Self-pay | Admitting: Family Medicine

## 2020-07-21 ENCOUNTER — Ambulatory Visit (INDEPENDENT_AMBULATORY_CARE_PROVIDER_SITE_OTHER): Payer: BC Managed Care – PPO | Admitting: Family Medicine

## 2020-07-21 ENCOUNTER — Other Ambulatory Visit: Payer: Self-pay

## 2020-07-21 DIAGNOSIS — M7989 Other specified soft tissue disorders: Secondary | ICD-10-CM

## 2020-07-21 DIAGNOSIS — G44309 Post-traumatic headache, unspecified, not intractable: Secondary | ICD-10-CM | POA: Diagnosis not present

## 2020-07-21 MED ORDER — DOXYCYCLINE HYCLATE 100 MG PO TABS: 100.0000 mg | ORAL_TABLET | Freq: Two times a day (BID) | ORAL | 0 refills | Status: AC

## 2020-07-21 MED ORDER — VENLAFAXINE HCL ER 37.5 MG PO CP24: 37.5000 mg | ORAL_CAPSULE | Freq: Every day | ORAL | 0 refills | Status: DC

## 2020-07-21 NOTE — Patient Instructions (Addendum)
Good to see you effexor 37.5 Doxycycline 100 mg Out of work for 3 weeks See me again in 3 weeks call us if you feel you can get to work sooner

## 2020-07-21 NOTE — Assessment & Plan Note (Signed)
Patient is undergoing biopsy today.  In good care otherwise.

## 2020-07-21 NOTE — Assessment & Plan Note (Signed)
Patient does have a posttraumatic headache.  Patient states that there is currently doing very seldomly better.  Patient did have an MRI since last visit.  MRI of the neck and the head were unremarkable at this time.  Patient though continues to have headaches.  Does have a strong family history of migraines in his mother.  Discussed with patient at this point we will start him on Effexor at a low dose.  Was found to have a very mild sinus infection and will be treated as well.  We will see how patient responds to these treatments.  Patient has been out of work for quite some time but will keep him out secondary to continuing to have difficulty with this and still further evaluation of the soft tissue mass by other providers.  Follow-up with me again in 3 weeks and hopefully at that point we will start the return to work progression.  Patient was given the choice to see neurology as well which they declined today.

## 2020-07-22 ENCOUNTER — Ambulatory Visit (HOSPITAL_BASED_OUTPATIENT_CLINIC_OR_DEPARTMENT_OTHER): Payer: BC Managed Care – PPO | Admitting: Physical Therapy

## 2020-07-24 ENCOUNTER — Other Ambulatory Visit: Payer: Self-pay

## 2020-07-24 ENCOUNTER — Encounter (HOSPITAL_BASED_OUTPATIENT_CLINIC_OR_DEPARTMENT_OTHER): Payer: Self-pay | Admitting: Physical Therapy

## 2020-07-24 ENCOUNTER — Ambulatory Visit (HOSPITAL_BASED_OUTPATIENT_CLINIC_OR_DEPARTMENT_OTHER): Payer: BC Managed Care – PPO | Admitting: Physical Therapy

## 2020-07-24 DIAGNOSIS — M542 Cervicalgia: Secondary | ICD-10-CM

## 2020-07-24 DIAGNOSIS — R293 Abnormal posture: Secondary | ICD-10-CM

## 2020-07-24 NOTE — Therapy (Signed)
[facility] [address] [place], [place], [address] Phone: [phone]   Fax:  [phone]  Physical Therapy Treatment  Patient Details  Name: Marcelo Ickes MRN: [id number] Date of Birth: 1991/01/08 Referring Provider (PT): Hulan Saas, DO   Encounter Date: 07/24/2020   PT End of Session - 07/24/20 1522    Visit Number 11    Number of Visits 13    Date for PT Re-Evaluation 07/31/20    Authorization Type BCBS- 90 VL    Progress Note Due on Visit 20    PT Start Time 1516    PT Stop Time 1555    PT Time Calculation (min) 39 min    Activity Tolerance Patient tolerated treatment well    Behavior During Therapy [facility] for tasks assessed/performed           Past Medical History:  Diagnosis Date  . GERD (gastroesophageal reflux disease)     History reviewed. No pertinent surgical history.  There were no vitals filed for this visit.   Subjective Assessment - 07/24/20 1520    Subjective Has a lump at Rt inf/post rib cage that is keeping me from sleeping due to pain. took meds so I don't have a HA right now.    Patient Stated Goals decrease pain, lifting    Currently in Pain? Yes    Pain Score 7     Pain Descriptors / Indicators Sore              [facility] PT Assessment - 07/24/20 0001      Assessment   Medical Diagnosis cervicalgia    Referring Provider (PT) Hulan Saas, DO    Onset Date/Surgical Date 06/05/20      Precautions   Precautions None      Restrictions   Weight Bearing Restrictions No      AROM   Cervical - Right Rotation 58    Cervical - Left Rotation 58                         [facility] Adult PT Treatment/Exercise - 07/24/20 0001      Neck Exercises: Seated   Other Seated Exercise ext rot green tband    Other Seated Exercise W green tband      Neck Exercises: Supine   Shoulder Flexion Limitations supine 3lb bar- cues for scap retraction at end extension    Upper Extremity D2 10  reps   2 sets   UE D2 Weights (lbs) 2    Other Supine Exercise skull crusher 6lb      Manual Therapy   Manual therapy comments skilled palpation and monitoring during TPDN    Soft tissue mobilization bil upper traps            Trigger Point Dry Needling - 07/24/20 0001    Consent Given? Yes    Education Handout Provided --   verbal education   Muscles Treated Head and Neck Upper trapezius    Upper Trapezius Response Twitch reponse elicited;Palpable increased muscle length   bil               PT Education - 07/24/20 1540    Education Details TPDN & expected outcomes    Person(s) Educated Patient    Methods Explanation    Comprehension Verbalized understanding            PT Short Term Goals - 07/13/20 1141      PT  SHORT TERM GOAL #1   Title 10 deg improvement in rotation & SB    Baseline see flowsheet    Status Partially Met             PT Long Term Goals - 07/15/20 1148      PT LONG TERM GOAL #1   Title cervical rotation bilaterally to 60 deg    Baseline met passively but not actively    Status Partially Met      PT LONG TERM GOAL #2   Title gross UE strength 5/5    Baseline gross 5/5 with bil pain    Status Partially Met      PT LONG TERM GOAL #3   Title able to demo proper form for bending/lifting    Baseline slouching posture    Status On-going      PT LONG TERM GOAL #4   Title resolution of UE symptoms    Baseline Lt hand N/T on and off    Status On-going                 Plan - 07/24/20 1558    Clinical Impression Statement Pt responded well to dN to bil upper traps and was able to demo incr in cervical rotation following. Going to surgeon on Monday to discuss removal of liposarcoma.    PT Treatment/Interventions ADLs/Self Care Home Management;Cryotherapy;Traction;Moist Heat;Functional mobility training;Therapeutic activities;Therapeutic exercise;Neuromuscular re-education;Manual techniques;Patient/family education;Passive range of  motion;Taping;Dry needling    PT Next Visit Plan outcome of DN? cont to progress strength, begin to consider ERO v D/C?    PT Home Exercise Plan [id number]    Consulted and Agree with Plan of Care Patient           Patient will benefit from skilled therapeutic intervention in order to improve the following deficits and impairments:  Decreased range of motion,Increased muscle spasms,Decreased activity tolerance,Pain,Improper body mechanics,Impaired flexibility,Impaired sensation,Postural dysfunction  Visit Diagnosis: Abnormal posture - Plan: PT plan of care cert/re-cert  Cervicalgia - Plan: PT plan of care cert/re-cert     Problem List Patient Active Problem List   Diagnosis Date Noted  . 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 Mild concussion 06/12/2020  . Whiplash 06/12/2020  . Soft tissue mass 06/12/2020   Nakoa Ganus C. Joslyne Marshburn PT, DPT 07/24/20 4:01 PM   [facility] [address] [place], [place], [address] Phone: [phone]   Fax:  [phone]  Name: Cage Gupton MRN: [id number] Date of Birth: Jul 14, 1990

## 2020-07-27 ENCOUNTER — Other Ambulatory Visit: Payer: Self-pay

## 2020-07-27 ENCOUNTER — Encounter (HOSPITAL_BASED_OUTPATIENT_CLINIC_OR_DEPARTMENT_OTHER): Payer: Self-pay | Admitting: Physical Therapy

## 2020-07-27 ENCOUNTER — Ambulatory Visit (HOSPITAL_BASED_OUTPATIENT_CLINIC_OR_DEPARTMENT_OTHER): Payer: BC Managed Care – PPO | Admitting: Physical Therapy

## 2020-07-27 DIAGNOSIS — M542 Cervicalgia: Secondary | ICD-10-CM

## 2020-07-27 DIAGNOSIS — R293 Abnormal posture: Secondary | ICD-10-CM

## 2020-07-28 NOTE — Therapy (Signed)
[facility] [address] [place], [place], [address] Phone: [phone]   Fax:  [phone]  Physical Therapy Treatment  Patient Details  Name: Kevin Foley MRN: [id number] Date of Birth: 01/31/1991 Referring Provider (PT): Hulan Saas, DO   Encounter Date: 07/27/2020   PT End of Session - 07/27/20 1145    Visit Number 12    Number of Visits 13    Date for PT Re-Evaluation 07/31/20    Authorization Type BCBS- 90 VL    PT Start Time 1140    PT Stop Time 1219    PT Time Calculation (min) 39 min    Activity Tolerance Patient tolerated treatment well    Behavior During Therapy [facility] for tasks assessed/performed           Past Medical History:  Diagnosis Date  . GERD (gastroesophageal reflux disease)     History reviewed. No pertinent surgical history.  There were no vitals filed for this visit.   Subjective Assessment - 07/27/20 1143    Subjective Surgery to be around 26th. neck really doesn't feel all that bad. felt better after DN- decr pain around neck lateral and anterior- just pain on post neck.    Patient Stated Goals decrease pain, lifting    Currently in Pain? Yes    Pain Score 6     Pain Location Neck    Pain Descriptors / Indicators Aching                             [facility] Adult PT Treatment/Exercise - 07/27/20 0001      Neck Exercises: Supine   Other Supine Exercise on 1/2 foam roll: T, W, scissors, snow angels      Neck Exercises: Prone   Other Prone Exercise qped triceps 2lb x15    Other Prone Exercise qped low trap Y, single arm lift      Manual Therapy   Joint Mobilization thoracic PA & unilat PA, Rt rib cage mobility- noted elevation on T8 level      Neck Exercises: Stretches   Other Neck Stretches thoracic mob over 1/2 foam roll    Other Neck Stretches qped cat/camel                  PT Education - 07/27/20 1506    Education Details POC    Person(s) Educated  Patient;Spouse    Methods Explanation    Comprehension Verbalized understanding;Need further instruction            PT Short Term Goals - 07/13/20 1141      PT SHORT TERM GOAL #1   Title 10 deg improvement in rotation & SB    Baseline see flowsheet    Status Partially Met             PT Long Term Goals - 07/15/20 1148      PT LONG TERM GOAL #1   Title cervical rotation bilaterally to 60 deg    Baseline met passively but not actively    Status Partially Met      PT LONG TERM GOAL #2   Title gross UE strength 5/5    Baseline gross 5/5 with bil pain    Status Partially Met      PT LONG TERM GOAL #3   Title able to demo proper form for bending/lifting    Baseline slouching posture    Status On-going  PT LONG TERM GOAL #4   Title resolution of UE symptoms    Baseline Lt hand N/T on and off    Status On-going                 Plan - 07/27/20 1507    Clinical Impression Statement Excellent outcome from DN to upper traps. Midline TTP at T3 noted without pain in unilateral mobs. Original POC ends this week and will perform re-evaluation at next visit. He is having surgery for lumbar liposarcoma next week and reports he will be in the hospital for a few days. if needed, we will place his episode on Hold until medical clearance is received post op.    PT Treatment/Interventions ADLs/Self Care Home Management;Cryotherapy;Traction;Moist Heat;Functional mobility training;Therapeutic activities;Therapeutic exercise;Neuromuscular re-education;Manual techniques;Patient/family education;Passive range of motion;Taping;Dry needling    PT Next Visit Plan re-evaluation    PT Home Exercise Plan [id number]    Consulted and Agree with Plan of Care Patient;Family member/caregiver    Family Member Consulted wife           Patient will benefit from skilled therapeutic intervention in order to improve the following deficits and impairments:  Decreased range of motion,Increased  muscle spasms,Decreased activity tolerance,Pain,Improper body mechanics,Impaired flexibility,Impaired sensation,Postural dysfunction  Visit Diagnosis: Abnormal posture  Cervicalgia     Problem List Patient Active Problem List   Diagnosis Date Noted  . 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 Mild concussion 06/12/2020  . Whiplash 06/12/2020  . Soft tissue mass 06/12/2020   Jamise Pentland C. Proctor Carriker PT, DPT 07/28/20 7:49 AM   [facility] [place], [place], [address] Phone: [phone]   Fax:  [phone]  Name: Kevin Foley MRN: [id number] Date of Birth: March 11, 1991

## 2020-07-31 ENCOUNTER — Ambulatory Visit (HOSPITAL_BASED_OUTPATIENT_CLINIC_OR_DEPARTMENT_OTHER): Payer: BC Managed Care – PPO | Admitting: Physical Therapy

## 2020-07-31 ENCOUNTER — Encounter (HOSPITAL_BASED_OUTPATIENT_CLINIC_OR_DEPARTMENT_OTHER): Payer: Self-pay | Admitting: Physical Therapy

## 2020-07-31 ENCOUNTER — Other Ambulatory Visit: Payer: Self-pay

## 2020-07-31 DIAGNOSIS — M542 Cervicalgia: Secondary | ICD-10-CM | POA: Diagnosis not present

## 2020-07-31 DIAGNOSIS — R293 Abnormal posture: Secondary | ICD-10-CM

## 2020-07-31 NOTE — Therapy (Signed)
[facility] [address] [place], [place], [address] Phone: [phone]   Fax:  [phone]  Physical Therapy Treatment  Patient Details  Name: Kevin Foley MRN: [id number] Date of Birth: 1990-05-30 Referring Provider (PT): Hulan Saas, DO   Encounter Date: 07/31/2020   PT End of Session - 07/31/20 1231    Visit Number 13    Number of Visits 13    Date for PT Re-Evaluation 07/31/20    Authorization Type BCBS- 90 VL    Progress Note Due on Visit 20    PT Start Time 1200    PT Stop Time 1231    PT Time Calculation (min) 31 min    Activity Tolerance Patient tolerated treatment well    Behavior During Therapy [facility] for tasks assessed/performed           Past Medical History:  Diagnosis Date  . GERD (gastroesophageal reflux disease)     History reviewed. No pertinent surgical history.  There were no vitals filed for this visit.   Subjective Assessment - 07/31/20 1203    Subjective Surgery moved to 6/14. Taking 1 or 2 tylenol instead of 3. Slight HA on Lt frontal region. Tension feels good.    Patient Stated Goals decrease pain, lifting    Currently in Pain? Yes    Pain Score 4     Pain Location Neck    Pain Descriptors / Indicators Sore                             [facility] Adult PT Treatment/Exercise - 07/31/20 0001      Neck Exercises: Prone   Plank knees/elbows    Other Prone Exercise prone scap retraction, prone retraction + ext    Other Prone Exercise qped triceps, Y all with 2lb      Manual Therapy   Joint Mobilization upper thoracic PA central & unilat (bilat)    Soft tissue mobilization bil thoracic paraspinals                    PT Short Term Goals - 07/13/20 1141      PT SHORT TERM GOAL #1   Title 10 deg improvement in rotation & SB    Baseline see flowsheet    Status Partially Met             PT Long Term Goals - 07/15/20 1148      PT LONG TERM GOAL #1   Title  cervical rotation bilaterally to 60 deg    Baseline met passively but not actively    Status Partially Met      PT LONG TERM GOAL #2   Title gross UE strength 5/5    Baseline gross 5/5 with bil pain    Status Partially Met      PT LONG TERM GOAL #3   Title able to demo proper form for bending/lifting    Baseline slouching posture    Status On-going      PT LONG TERM GOAL #4   Title resolution of UE symptoms    Baseline Lt hand N/T on and off    Status On-going                 Plan - 07/31/20 1236    Clinical Impression Statement Pt arrived late today so focused on manual therapy and therex for postural strengthening and will perform re-eval at next scheduled  visit. Surgery was delayed slightly so I asked him to start doing planks daily for core strength and stabiility until then.    PT Treatment/Interventions ADLs/Self Care Home Management;Cryotherapy;Traction;Moist Heat;Functional mobility training;Therapeutic activities;Therapeutic exercise;Neuromuscular re-education;Manual techniques;Patient/family education;Passive range of motion;Taping;Dry needling    PT Next Visit Plan re-evaluation    PT Home Exercise Plan [id number]    Consulted and Agree with Plan of Care Patient           Patient will benefit from skilled therapeutic intervention in order to improve the following deficits and impairments:  Decreased range of motion,Increased muscle spasms,Decreased activity tolerance,Pain,Improper body mechanics,Impaired flexibility,Impaired sensation,Postural dysfunction  Visit Diagnosis: Abnormal posture  Cervicalgia     Problem List Patient Active Problem List   Diagnosis Date Noted  . 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 Mild concussion 06/12/2020  . Whiplash 06/12/2020  . Soft tissue mass 06/12/2020    Wyett Narine C. Winter Trefz PT, DPT 07/31/20 12:38 PM   [facility] [address] [place], [place],  [address] Phone: [phone]   Fax:  [phone]  Name: Kevin Foley MRN: [id number] Date of Birth: 1990/08/21

## 2020-08-03 ENCOUNTER — Encounter (HOSPITAL_BASED_OUTPATIENT_CLINIC_OR_DEPARTMENT_OTHER): Payer: Self-pay | Admitting: Physical Therapy

## 2020-08-03 ENCOUNTER — Other Ambulatory Visit: Payer: Self-pay

## 2020-08-03 ENCOUNTER — Ambulatory Visit (HOSPITAL_BASED_OUTPATIENT_CLINIC_OR_DEPARTMENT_OTHER): Payer: BC Managed Care – PPO | Admitting: Physical Therapy

## 2020-08-03 DIAGNOSIS — M542 Cervicalgia: Secondary | ICD-10-CM

## 2020-08-03 DIAGNOSIS — R293 Abnormal posture: Secondary | ICD-10-CM

## 2020-08-03 NOTE — Therapy (Signed)
[facility] [address] [place], [place], [address] Phone: [phone]   Fax:  [phone]  Physical Therapy Treatment/Discharge  Patient Details  Name: Kevin Foley MRN: [id number] Date of Birth: 02-12-1991 Referring Provider (PT): Hulan Saas, DO   Encounter Date: 08/03/2020   PT End of Session - 08/03/20 1302    Visit Number 14    Number of Visits 14    Date for PT Re-Evaluation 08/03/20    Authorization Type BCBS- 90 VL    Progress Note Due on Visit 20    PT Start Time 1300    PT Stop Time 1323    PT Time Calculation (min) 23 min    Activity Tolerance Patient tolerated treatment well    Behavior During Therapy [facility] for tasks assessed/performed           Past Medical History:  Diagnosis Date  . GERD (gastroesophageal reflux disease)     History reviewed. No pertinent surgical history.  There were no vitals filed for this visit.   Subjective Assessment - 08/03/20 1303    Subjective Mild pain today. No HA for about the last 2 days. Is goin to Dr Tamala Julian on the 31st and may go back to work but will have to be out following removal of liposarcoma.    Patient Stated Goals decrease pain, lifting    Currently in Pain? Yes    Pain Score 2     Pain Location Neck    Pain Descriptors / Indicators Sore    Aggravating Factors  constant tightness but better    Pain Relieving Factors rest              [facility] PT Assessment - 08/03/20 0001      Assessment   Medical Diagnosis cervicalgia    Referring Provider (PT) Hulan Saas, DO    Onset Date/Surgical Date 06/05/20      AROM   Cervical - Right Rotation 60    Cervical - Left Rotation 64                                 PT Education - 08/03/20 1322    Education Details aquatic exercises, return to work.    Person(s) Educated Patient    Methods Explanation    Comprehension Verbalized understanding            PT Short Term Goals -  07/13/20 1141      PT SHORT TERM GOAL #1   Title 10 deg improvement in rotation & SB    Baseline see flowsheet    Status Partially Met             PT Long Term Goals - 08/03/20 1306      PT LONG TERM GOAL #1   Title cervical rotation bilaterally to 60 deg    Baseline Lt 64, Rt 60    Status Achieved      PT LONG TERM GOAL #2   Title gross UE strength 5/5    Status Achieved      PT LONG TERM GOAL #3   Title able to demo proper form for bending/lifting    Status Achieved      PT LONG TERM GOAL #4   Title resolution of UE symptoms    Baseline occasional in Lt hand    Status Partially Met  Plan - 08/03/20 1335    Clinical Impression Statement Pt has made significant progress in neck/HA pain since beginning PT and is prepared for d/c to independent program at this time. 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 He has a pool at his apartment so we discussed aquatic exercises that he can do for full body strength in prep for surgery and return to work.    PT Treatment/Interventions ADLs/Self Care Home Management;Cryotherapy;Traction;Moist Heat;Functional mobility training;Therapeutic activities;Therapeutic exercise;Neuromuscular re-education;Manual techniques;Patient/family education;Passive range of motion;Taping;Dry needling    PT Home Exercise Plan [id number]    Consulted and Agree with Plan of Care Patient           Patient will benefit from skilled therapeutic intervention in order to improve the following deficits and impairments:  Decreased range of motion,Increased muscle spasms,Decreased activity tolerance,Pain,Improper body mechanics,Impaired flexibility,Impaired sensation,Postural dysfunction  Visit Diagnosis: Abnormal posture - Plan: PT plan of care cert/re-cert  Cervicalgia - Plan: PT plan of care cert/re-cert     Problem List Patient Active Problem List    Diagnosis Date Noted  . 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 Mild concussion 06/12/2020  . Whiplash 06/12/2020  . Soft tissue mass 06/12/2020    PHYSICAL THERAPY DISCHARGE SUMMARY  Visits from Start of Care: 14  Current functional level related to goals / functional outcomes: See above   Remaining deficits: See above   Education / Equipment: Anatomy of condition, POC, HEP, exercise form/rationale  Plan: Patient agrees to discharge.  Patient goals were partially met. Patient is being discharged due to being pleased with the current functional level.  ?????     Izzak Fries C. Nolberto Cheuvront PT, DPT 08/03/20 1:40 PM   [facility] [address] [place], [place], [address] Phone: [phone]   Fax:  [phone]  Name: Kevin Foley MRN: [id number] Date of Birth: 1991-03-05

## 2020-08-07 ENCOUNTER — Encounter (HOSPITAL_BASED_OUTPATIENT_CLINIC_OR_DEPARTMENT_OTHER): Payer: Self-pay | Admitting: Physical Therapy

## 2020-08-07 NOTE — Progress Notes (Signed)
[facility] [place] Phone: [phone] Subjective:   Fontaine No, am serving as a scribe for Dr. Hulan Saas. This visit occurred during the SARS-CoV-2 public health emergency.  Safety protocols were in place, including screening questions prior to the visit, additional usage of staff PPE, and extensive cleaning of exam room while observing appropriate contact time as indicated for disinfecting solutions.   I'm seeing this patient by the request  of:  Patient, No Pcp Per (Inactive)  CC: Posttraumatic headache follow-up, back pain follow-up  [id number]   07/21/2020 Patient does have a posttraumatic headache.  Patient states that there is currently doing very seldomly better.  Patient did have an MRI since last visit.  MRI of the neck and the head were unremarkable at this time.  Patient though continues to have headaches.  Does have a strong family history of migraines in his mother.  Discussed with patient at this point we will start him on Effexor at a low dose.  Was found to have a very mild sinus infection and will be treated as well.  We will see how patient responds to these treatments.  Patient has been out of work for quite some time but will keep him out secondary to continuing to have difficulty with this and still further evaluation of the soft tissue mass by other providers.  Follow-up with me again in 3 weeks and hopefully at that point we will start the return to work progression.  Patient was given the choice to see neurology as well which they declined today.  Update 08/11/2020 Oshae Simmering is a 30 y.o. male coming in with complaint of headaches. Patient states that his headaches are improving. Not having them as frequent. No longer working with physical therapy.  Patient is still a little worried about more of the back.  Will be going under surgery on June 14.  Patient does state though that the headaches are  significantly less frequent as well as less severe.  Still having difficulty with some memory issues but otherwise feels like he is making progress.  Patient is on the Effexor which he has had no side effects and feels like it is doing relatively well.     Past Medical History:  Diagnosis Date  . GERD (gastroesophageal reflux disease)    No past surgical history on file. Social History   Socioeconomic History  . Marital status: Married    Spouse name: Not on file  . Number of children: Not on file  . Years of education: Not on file  . Highest education level: Not on file  Occupational History  . Not on file  Tobacco Use  . Smoking status: Current Every Day Smoker    Types: Cigarettes  . Smokeless tobacco: Never Used  Substance and Sexual Activity  . Alcohol use: Yes  . Drug use: No  . Sexual activity: Not on file  Other Topics Concern  . Not on file  Social History Narrative  . Not on file   Social Determinants of Health   Financial Resource Strain: Not on file  Food Insecurity: Not on file  Transportation Needs: Not on file  Physical Activity: Not on file  Stress: Not on file  Social Connections: Not on file   No Known Allergies No family history on file.  Current Outpatient Medications (Endocrine & Metabolic):  .  predniSONE (DELTASONE) 10 MG tablet, Take 2 tablets (20 mg total) by mouth  2 (two) times daily with a meal.    Current Outpatient Medications (Analgesics):  .  acetaminophen (TYLENOL) 500 MG tablet, Take 500 mg by mouth every 6 (six) hours as needed for moderate pain. Marland Kitchen  HYDROcodone-acetaminophen (NORCO) 5-325 MG tablet, Take 1-2 tablets by mouth every 6 (six) hours as needed. Marland Kitchen  ibuprofen (ADVIL) 200 MG tablet, Take 600 mg by mouth every 6 (six) hours as needed for mild pain.   Current Outpatient Medications (Other):  .  doxycycline (VIBRA-TABS) 100 MG tablet, Take 1 tablet (100 mg total) by mouth 2 (two) times daily. .  methocarbamol (ROBAXIN)  500 MG tablet, Take 500 mg by mouth every 6 (six) hours as needed for muscle spasms. Marland Kitchen  venlafaxine XR (EFFEXOR XR) 37.5 MG 24 hr capsule, Take 1 capsule (37.5 mg total) by mouth daily with breakfast.   Reviewed prior external information including notes and imaging from  primary care provider As well as notes that were available from care everywhere and other healthcare systems.  Past medical history, social, surgical and family history all reviewed in electronic medical record.  No pertanent information unless stated regarding to the chief complaint.   Review of Systems:  No  visual changes, nausea, vomiting, diarrhea, constipation, dizziness, abdominal pain, skin rash, fevers, chills, night sweats, weight loss, swollen lymph nodes, body aches, joint swelling, chest pain, shortness of breath, mood changes. POSITIVE muscle aches, headache  Objective  Blood pressure 116/84, pulse (!) 101, height 5\' 11"  (1.803 m), weight 284 lb (128.8 kg), SpO2 98 %.   General: No apparent distress alert and oriented x3 mood and affect normal, dressed appropriately.  HEENT: Pupils equal, extraocular movements intact no nystagmus noted. Respiratory: Patient's speak in full sentences and does not appear short of breath  Cardiovascular: No lower extremity edema, non tender, no erythema  Gait normal with good balance and coordination.  MSK    Impression and Recommendations:     The above documentation has been reviewed and is accurate and complete Lyndal Pulley, DO

## 2020-08-11 ENCOUNTER — Ambulatory Visit (INDEPENDENT_AMBULATORY_CARE_PROVIDER_SITE_OTHER): Payer: BC Managed Care – PPO | Admitting: Family Medicine

## 2020-08-11 ENCOUNTER — Encounter: Payer: Self-pay | Admitting: Family Medicine

## 2020-08-11 ENCOUNTER — Other Ambulatory Visit: Payer: Self-pay

## 2020-08-11 DIAGNOSIS — G44309 Post-traumatic headache, unspecified, not intractable: Secondary | ICD-10-CM | POA: Diagnosis not present

## 2020-08-11 NOTE — Patient Instructions (Signed)
Good to see you See me again in 4 weeks

## 2020-08-11 NOTE — Assessment & Plan Note (Signed)
Patient is making significant strides at this time. Likely would be able to start to potentially go back to work but secondary to other health problems including the soft tissue mass and having surgery unchanged.  I do believe that keeping patient out of work at this point may be beneficial for his headaches as well as potentially stress level.  Patient will continue with the Effexor at this moment.  Can consider the possibility of increasing if needed.  Patient will follow up with me again in 4 weeks which will be approximately 2 weeks after surgery to see how he is responding and then see if we can start to get him back to with a slow progression back to work patient is accompanied with wife who agree with the plan.

## 2020-08-14 ENCOUNTER — Encounter (HOSPITAL_BASED_OUTPATIENT_CLINIC_OR_DEPARTMENT_OTHER): Payer: Self-pay | Admitting: Physical Therapy

## 2020-08-17 ENCOUNTER — Encounter (HOSPITAL_BASED_OUTPATIENT_CLINIC_OR_DEPARTMENT_OTHER): Payer: Self-pay | Admitting: Physical Therapy

## 2020-08-19 ENCOUNTER — Encounter (HOSPITAL_BASED_OUTPATIENT_CLINIC_OR_DEPARTMENT_OTHER): Payer: Self-pay | Admitting: Physical Therapy

## 2020-08-24 ENCOUNTER — Encounter (HOSPITAL_BASED_OUTPATIENT_CLINIC_OR_DEPARTMENT_OTHER): Payer: Self-pay | Admitting: Physical Therapy

## 2020-08-26 ENCOUNTER — Encounter (HOSPITAL_BASED_OUTPATIENT_CLINIC_OR_DEPARTMENT_OTHER): Payer: Self-pay | Admitting: Physical Therapy

## 2020-08-26 ENCOUNTER — Other Ambulatory Visit: Payer: Self-pay | Admitting: Family Medicine

## 2020-08-28 ENCOUNTER — Encounter: Payer: Self-pay | Admitting: Family Medicine

## 2020-08-31 ENCOUNTER — Encounter (HOSPITAL_BASED_OUTPATIENT_CLINIC_OR_DEPARTMENT_OTHER): Payer: Self-pay | Admitting: Physical Therapy

## 2020-09-02 ENCOUNTER — Encounter (HOSPITAL_BASED_OUTPATIENT_CLINIC_OR_DEPARTMENT_OTHER): Payer: Self-pay | Admitting: Physical Therapy

## 2020-09-07 ENCOUNTER — Encounter (HOSPITAL_BASED_OUTPATIENT_CLINIC_OR_DEPARTMENT_OTHER): Payer: Self-pay | Admitting: Physical Therapy

## 2020-09-09 NOTE — Progress Notes (Signed)
[facility] [address] [place] Phone: [phone] Subjective:   I Kandace Blitz am serving as a Education administrator for Dr. Hulan Saas.  This visit occurred during the SARS-CoV-2 public health emergency.  Safety protocols were in place, including screening questions prior to the visit, additional usage of staff PPE, and extensive cleaning of exam room while observing appropriate contact time as indicated for disinfecting solutions.   I'm seeing this patient by the request  of:  Patient, No Pcp Per (Inactive)  CC: Head injury follow-up  [id number]  08/11/2020 Patient is making significant strides at this time. Likely would be able to start to potentially go back to work but secondary to other health problems including the soft tissue mass and having surgery unchanged.  I do believe that keeping patient out of work at this point may be beneficial for his headaches as well as potentially stress level.  Patient will continue with the Effexor at this moment.  Can consider the possibility of increasing if needed.  Patient will follow up with me again in 4 weeks which will be approximately 2 weeks after surgery to see how he is responding and then see if we can start to get him back to with a slow progression back to work patient is accompanied with wife who agree with the plan.  Update 09/10/2020 Kevin Foley is a 30 y.o. male coming in with complaint of head injury and back pain. Patient sent MyChart message stating surgery was going to be moved to potentially August. Patient states he still has short term memory loss. States he doesn't feel like he has made much progress.  Patient initially was given Effexor but did not like how it made him feel.  Still awaiting surgery for the soft tissue mass of the lower back.  Now scheduled in August.     Past Medical History:  Diagnosis Date   GERD (gastroesophageal reflux disease)    No past surgical history on  file. Social History   Socioeconomic History   Marital status: Married    Spouse name: Not on file   Number of children: Not on file   Years of education: Not on file   Highest education level: Not on file  Occupational History   Not on file  Tobacco Use   Smoking status: Every Day    Pack years: 0.00    Types: Cigarettes   Smokeless tobacco: Never  Substance and Sexual Activity   Alcohol use: Yes   Drug use: No   Sexual activity: Not on file  Other Topics Concern   Not on file  Social History Narrative   Not on file   Social Determinants of Health   Financial Resource Strain: Not on file  Food Insecurity: Not on file  Transportation Needs: Not on file  Physical Activity: Not on file  Stress: Not on file  Social Connections: Not on file   No Known Allergies No family history on file.  Current Outpatient Medications (Endocrine & Metabolic):    predniSONE (DELTASONE) 10 MG tablet, Take 2 tablets (20 mg total) by mouth 2 (two) times daily with a meal.    Current Outpatient Medications (Analgesics):    acetaminophen (TYLENOL) 500 MG tablet, Take 500 mg by mouth every 6 (six) hours as needed for moderate pain.   HYDROcodone-acetaminophen (NORCO) 5-325 MG tablet, Take 1-2 tablets by mouth every 6 (six) hours as needed.   ibuprofen (ADVIL) 200 MG tablet, Take 600  mg by mouth every 6 (six) hours as needed for mild pain.   Current Outpatient Medications (Other):    doxycycline (VIBRA-TABS) 100 MG tablet, Take 1 tablet (100 mg total) by mouth 2 (two) times daily.   methocarbamol (ROBAXIN) 500 MG tablet, Take 500 mg by mouth every 6 (six) hours as needed for muscle spasms.   nortriptyline (PAMELOR) 10 MG capsule, Take 1 capsule (10 mg total) by mouth at bedtime.   Reviewed prior external information including notes and imaging from  primary care provider As well as notes that were available from care everywhere and other healthcare systems.  Past medical history,  social, surgical and family history all reviewed in electronic medical record.  No pertanent information unless stated regarding to the chief complaint.   Review of Systems:  No , visual changes, nausea, vomiting, diarrhea, constipation, dizziness, abdominal pain, skin rash, fevers, chills, night sweats, weight loss, swollen lymph nodes, body aches, joint swelling, chest pain, shortness of breath, mood changes. POSITIVE muscle aches, headache intermittently but very minimal  Objective  Blood pressure 100/80, pulse 85, height 5\' 11"  (1.803 m), weight 286 lb (129.7 kg), SpO2 97 %.   General: No apparent distress alert and oriented x3 mood and affect normal, dressed appropriately.  HEENT: Pupils equal, extraocular movements intact  Respiratory: Patient's speak in full sentences and does not appear short of breath  Cardiovascular: No lower extremity edema, non tender, no erythema  Gait normal with good balance and coordination.     Impression and Recommendations:     The above documentation has been reviewed and is accurate and complete Lyndal Pulley, DO Still difficulty with concentration.

## 2020-09-10 ENCOUNTER — Ambulatory Visit: Payer: BC Managed Care – PPO | Admitting: Family Medicine

## 2020-09-15 ENCOUNTER — Ambulatory Visit (INDEPENDENT_AMBULATORY_CARE_PROVIDER_SITE_OTHER): Payer: BC Managed Care – PPO | Admitting: Family Medicine

## 2020-09-15 ENCOUNTER — Encounter: Payer: Self-pay | Admitting: Family Medicine

## 2020-09-15 ENCOUNTER — Other Ambulatory Visit: Payer: Self-pay

## 2020-09-15 VITALS — BP 100/80 | HR 85 | Ht 71.0 in | Wt 286.0 lb

## 2020-09-15 DIAGNOSIS — M7989 Other specified soft tissue disorders: Secondary | ICD-10-CM | POA: Diagnosis not present

## 2020-09-15 DIAGNOSIS — G44309 Post-traumatic headache, unspecified, not intractable: Secondary | ICD-10-CM | POA: Diagnosis not present

## 2020-09-15 MED ORDER — NORTRIPTYLINE HCL 10 MG PO CAPS: 10.0000 mg | ORAL_CAPSULE | Freq: Every day | ORAL | 1 refills | Status: AC

## 2020-09-15 NOTE — Assessment & Plan Note (Signed)
Continues to have symptoms of posttraumatic headache.  Patient was unable to tolerate the Effexor.  Imaging has been unremarkable.  Started on nortriptyline.  Patient will start a slow progression back to work.  Patient given a note today.  Worsening pain to call us.  Patient is still awaiting to have the resection of the soft tissue mass noted but likely will not be until August.  Follow-up with me again in 6 weeks where hopefully will be able to fully release patient at that time regarding the head injury.

## 2020-09-15 NOTE — Patient Instructions (Addendum)
Good to see you Nortriptyline 10 mg See me again in 6 weeks to fully release

## 2020-10-28 NOTE — Progress Notes (Deleted)
[place] Phone: [phone] Subjective:    I'm seeing this patient by the request  of:  Patient, No Pcp Per (Inactive)  CC:   [id number]  09/15/2020 Continues to have symptoms of posttraumatic headache.  Patient was unable to tolerate the Effexor.  Imaging has been unremarkable.  Started on nortriptyline.  Patient will start a slow progression back to work.  Patient given a note today.  Worsening pain to call us.  Patient is still awaiting to have the resection of the soft tissue mass noted but likely will not be until August.  Follow-up with me again in 6 weeks where hopefully will be able to fully release patient at that time regarding the head injury.  Update 10/29/2020 Kevin Foley is a 30 y.o. male coming in with complaint of headaches. Patient was post-traumatic, unable to tolerate effexor, started on nortriptyline. Was progressing back to work. Patient states        Past Medical History:  Diagnosis Date   GERD (gastroesophageal reflux disease)    No past surgical history on file. Social History   Socioeconomic History   Marital status: Married    Spouse name: Not on file   Number of children: Not on file   Years of education: Not on file   Highest education level: Not on file  Occupational History   Not on file  Tobacco Use   Smoking status: Every Day    Types: Cigarettes   Smokeless tobacco: Never  Substance and Sexual Activity   Alcohol use: Yes   Drug use: No   Sexual activity: Not on file  Other Topics Concern   Not on file  Social History Narrative   Not on file   Social Determinants of Health   Financial Resource Strain: Not on file  Food Insecurity: Not on file  Transportation Needs: Not on file  Physical Activity: Not on file  Stress: Not on file  Social Connections: Not on file   No Known Allergies No family history on file.  Current Outpatient Medications (Endocrine &  Metabolic):    predniSONE (DELTASONE) 10 MG tablet, Take 2 tablets (20 mg total) by mouth 2 (two) times daily with a meal.    Current Outpatient Medications (Analgesics):    acetaminophen (TYLENOL) 500 MG tablet, Take 500 mg by mouth every 6 (six) hours as needed for moderate pain.   HYDROcodone-acetaminophen (NORCO) 5-325 MG tablet, Take 1-2 tablets by mouth every 6 (six) hours as needed.   ibuprofen (ADVIL) 200 MG tablet, Take 600 mg by mouth every 6 (six) hours as needed for mild pain.   Current Outpatient Medications (Other):    doxycycline (VIBRA-TABS) 100 MG tablet, Take 1 tablet (100 mg total) by mouth 2 (two) times daily.   methocarbamol (ROBAXIN) 500 MG tablet, Take 500 mg by mouth every 6 (six) hours as needed for muscle spasms.   nortriptyline (PAMELOR) 10 MG capsule, Take 1 capsule (10 mg total) by mouth at bedtime.   Reviewed prior external information including notes and imaging from  primary care provider As well as notes that were available from care everywhere and other healthcare systems.  Past medical history, social, surgical and family history all reviewed in electronic medical record.  No pertanent information unless stated regarding to the chief complaint.   Review of Systems:  No headache, visual changes, nausea, vomiting, diarrhea, constipation, dizziness, abdominal pain, skin rash, fevers, chills, night sweats, weight loss,  swollen lymph nodes, body aches, joint swelling, chest pain, shortness of breath, mood changes. POSITIVE muscle aches  Objective  There were no vitals taken for this visit.   General: No apparent distress alert and oriented x3 mood and affect normal, dressed appropriately.  HEENT: Pupils equal, extraocular movements intact  Respiratory: Patient's speak in full sentences and does not appear short of breath  Cardiovascular: No lower extremity edema, non tender, no erythema  Gait normal with good balance and coordination.  MSK:  Non tender  with full range of motion and good stability and symmetric strength and tone of shoulders, elbows, wrist, hip, knee and ankles bilaterally.     Impression and Recommendations:     The above documentation has been reviewed and is accurate and complete Lyndal Pulley, DO

## 2020-10-29 ENCOUNTER — Ambulatory Visit: Payer: BC Managed Care – PPO | Admitting: Family Medicine
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
## 2021-08-23 ENCOUNTER — Telehealth: Payer: Self-pay
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
## 2021-09-24 NOTE — Telephone Encounter (Signed)
Patient's wife called back to follow up. We went ahead and scheduled an appointment with Dr Tamala Julian on Tuesday to discuss further.  Any other advice in the mean time? Please advise.  # [phone]

## 2021-09-27 NOTE — Progress Notes (Deleted)
[place] Foley Phone: [phone] Subjective:    I'm seeing this patient by the request  of:  Patient, No Pcp Per  CC:   [id number]  09/15/2020 Continues to have symptoms of posttraumatic headache.  Patient was unable to tolerate the Effexor.  Imaging has been unremarkable.  Started on nortriptyline.  Patient will start a slow progression back to work.  Patient given a note today.  Worsening pain to call us.  Patient is still awaiting to have the resection of the soft tissue mass noted but likely will not be until August.  Follow-up with me again in 6 weeks where hopefully will be able to fully release patient at that time regarding the head injury.  Updated 09/28/2021 Kevin Foley is a 31 y.o. male coming in with complaint of continued short term memory loss since concussion last year.       Past Medical History:  Diagnosis Date   GERD (gastroesophageal reflux disease)    No past surgical history on file. Social History   Socioeconomic History   Marital status: Married    Spouse name: Not on file   Number of children: Not on file   Years of education: Not on file   Highest education level: Not on file  Occupational History   Not on file  Tobacco Use   Smoking status: Every Day    Types: Cigarettes   Smokeless tobacco: Never  Substance and Sexual Activity   Alcohol use: Yes   Drug use: No   Sexual activity: Not on file  Other Topics Concern   Not on file  Social History Narrative   Not on file   Social Determinants of Health   Financial Resource Strain: Not on file  Food Insecurity: Not on file  Transportation Needs: Not on file  Physical Activity: Not on file  Stress: Not on file  Social Connections: Not on file   No Known Allergies No family history on file.  Current Outpatient Medications (Endocrine & Metabolic):    predniSONE (DELTASONE) 10 MG tablet, Take 2 tablets (20 mg total) by  mouth 2 (two) times daily with a meal.    Current Outpatient Medications (Analgesics):    acetaminophen (TYLENOL) 500 MG tablet, Take 500 mg by mouth every 6 (six) hours as needed for moderate pain.   HYDROcodone-acetaminophen (NORCO) 5-325 MG tablet, Take 1-2 tablets by mouth every 6 (six) hours as needed.   ibuprofen (ADVIL) 200 MG tablet, Take 600 mg by mouth every 6 (six) hours as needed for mild pain.   Current Outpatient Medications (Other):    doxycycline (VIBRA-TABS) 100 MG tablet, Take 1 tablet (100 mg total) by mouth 2 (two) times daily.   methocarbamol (ROBAXIN) 500 MG tablet, Take 500 mg by mouth every 6 (six) hours as needed for muscle spasms.   nortriptyline (PAMELOR) 10 MG capsule, Take 1 capsule (10 mg total) by mouth at bedtime.   Reviewed prior external information including notes and imaging from  primary care provider As well as notes that were available from care everywhere and other healthcare systems.  Past medical history, social, surgical and family history all reviewed in electronic medical record.  No pertanent information unless stated regarding to the chief complaint.   Review of Systems:  No headache, visual changes, nausea, vomiting, diarrhea, constipation, dizziness, abdominal pain, skin rash, fevers, chills, night sweats, weight loss, swollen lymph nodes, body aches, joint swelling, chest pain, shortness of  breath, mood changes. POSITIVE muscle aches  Objective  There were no vitals taken for this visit.   General: No apparent distress alert and oriented x3 mood and affect normal, dressed appropriately.  HEENT: Pupils equal, extraocular movements intact  Respiratory: Patient's speak in full sentences and does not appear short of breath  Cardiovascular: No lower extremity edema, non tender, no erythema      Impression and Recommendations:    The above documentation has been reviewed and is accurate and complete Lyndal Pulley, DO

## 2021-09-28 ENCOUNTER — Ambulatory Visit: Payer: BC Managed Care – PPO | Admitting: Family Medicine

## 2021-12-16 NOTE — Progress Notes (Deleted)
  [place] Phone: [phone] Subjective:    I'm seeing this patient by the request  of:  Patient, No Pcp Per  CC:   [id number]  Kevin Foley is a 31 y.o. male coming in with complaint of post concussive syndrome. Last seen in July 2022. Patient states      Past Medical History:  Diagnosis Date   GERD (gastroesophageal reflux disease)    No past surgical history on file. Social History   Socioeconomic History   Marital status: Married    Spouse name: Not on file   Number of children: Not on file   Years of education: Not on file   Highest education level: Not on file  Occupational History   Not on file  Tobacco Use   Smoking status: Every Day    Types: Cigarettes   Smokeless tobacco: Never  Substance and Sexual Activity   Alcohol use: Yes   Drug use: No   Sexual activity: Not on file  Other Topics Concern   Not on file  Social History Narrative   Not on file   Social Determinants of Health   Financial Resource Strain: Not on file  Food Insecurity: Not on file  Transportation Needs: Not on file  Physical Activity: Not on file  Stress: Not on file  Social Connections: Not on file   No Known Allergies No family history on file.  Current Outpatient Medications (Endocrine & Metabolic):    predniSONE (DELTASONE) 10 MG tablet, Take 2 tablets (20 mg total) by mouth 2 (two) times daily with a meal.    Current Outpatient Medications (Analgesics):    acetaminophen (TYLENOL) 500 MG tablet, Take 500 mg by mouth every 6 (six) hours as needed for moderate pain.   HYDROcodone-acetaminophen (NORCO) 5-325 MG tablet, Take 1-2 tablets by mouth every 6 (six) hours as needed.   ibuprofen (ADVIL) 200 MG tablet, Take 600 mg by mouth every 6 (six) hours as needed for mild pain.   Current Outpatient Medications (Other):    doxycycline (VIBRA-TABS) 100 MG tablet, Take 1 tablet (100 mg total) by mouth 2  (two) times daily.   methocarbamol (ROBAXIN) 500 MG tablet, Take 500 mg by mouth every 6 (six) hours as needed for muscle spasms.   nortriptyline (PAMELOR) 10 MG capsule, Take 1 capsule (10 mg total) by mouth at bedtime.   Reviewed prior external information including notes and imaging from  primary care provider As well as notes that were available from care everywhere and other healthcare systems.  Past medical history, social, surgical and family history all reviewed in electronic medical record.  No pertanent information unless stated regarding to the chief complaint.   Review of Systems:  No headache, visual changes, nausea, vomiting, diarrhea, constipation, dizziness, abdominal pain, skin rash, fevers, chills, night sweats, weight loss, swollen lymph nodes, body aches, joint swelling, chest pain, shortness of breath, mood changes. POSITIVE muscle aches  Objective  There were no vitals taken for this visit.   General: No apparent distress alert and oriented x3 mood and affect normal, dressed appropriately.  HEENT: Pupils equal, extraocular movements intact  Respiratory: Patient's speak in full sentences and does not appear short of breath  Cardiovascular: No lower extremity edema, non tender, no erythema      Impression and Recommendations:

## 2021-12-20 ENCOUNTER — Ambulatory Visit: Payer: BC Managed Care – PPO | Admitting: Family Medicine

## 2021-12-21 ENCOUNTER — Encounter: Payer: Self-pay | Admitting: *Deleted
